# Patient Record
Sex: Female | Born: 1965 | Race: White | Hispanic: No | Marital: Married | Smoking: Never smoker
Health system: Southern US, Community
[De-identification: ages and names within clinical notes are randomized; demographics above are authoritative.]

## PROBLEM LIST (undated history)

## (undated) DIAGNOSIS — N840 Polyp of corpus uteri: Secondary | ICD-10-CM

## (undated) DIAGNOSIS — N926 Irregular menstruation, unspecified: Secondary | ICD-10-CM

---

## 1998-12-13 ENCOUNTER — Inpatient Hospital Stay (HOSPITAL_COMMUNITY): Admission: AD | Admit: 1998-12-13 | Discharge: 1998-12-16 | Payer: Self-pay | Admitting: Gynecology

## 1999-01-25 ENCOUNTER — Other Ambulatory Visit: Admission: RE | Admit: 1999-01-25 | Discharge: 1999-01-25 | Payer: Self-pay | Admitting: Gynecology

## 2000-04-11 ENCOUNTER — Ambulatory Visit (HOSPITAL_COMMUNITY): Admission: RE | Admit: 2000-04-11 | Discharge: 2000-04-11 | Payer: Self-pay | Admitting: Family Medicine

## 2000-04-11 ENCOUNTER — Encounter: Payer: Self-pay | Admitting: Family Medicine

## 2000-06-06 HISTORY — PX: TUBAL LIGATION: SHX77

## 2000-06-20 ENCOUNTER — Other Ambulatory Visit: Admission: RE | Admit: 2000-06-20 | Discharge: 2000-06-20 | Payer: Self-pay | Admitting: Gynecology

## 2001-06-06 ENCOUNTER — Ambulatory Visit (HOSPITAL_COMMUNITY): Admission: RE | Admit: 2001-06-06 | Discharge: 2001-06-06 | Payer: Self-pay | Admitting: Gynecology

## 2002-06-19 ENCOUNTER — Other Ambulatory Visit: Admission: RE | Admit: 2002-06-19 | Discharge: 2002-06-19 | Payer: Self-pay | Admitting: Gynecology

## 2002-11-28 ENCOUNTER — Ambulatory Visit (HOSPITAL_COMMUNITY): Admission: RE | Admit: 2002-11-28 | Discharge: 2002-11-28 | Payer: Self-pay | Admitting: Family Medicine

## 2002-11-28 ENCOUNTER — Encounter: Payer: Self-pay | Admitting: Family Medicine

## 2005-11-10 ENCOUNTER — Emergency Department (HOSPITAL_COMMUNITY): Admission: EM | Admit: 2005-11-10 | Discharge: 2005-11-10 | Payer: Self-pay | Admitting: Family Medicine

## 2006-09-15 ENCOUNTER — Other Ambulatory Visit: Admission: RE | Admit: 2006-09-15 | Discharge: 2006-09-15 | Payer: Self-pay | Admitting: Gynecology

## 2006-09-20 ENCOUNTER — Ambulatory Visit (HOSPITAL_COMMUNITY): Admission: RE | Admit: 2006-09-20 | Discharge: 2006-09-20 | Payer: Self-pay | Admitting: Gynecology

## 2006-10-03 ENCOUNTER — Encounter: Admission: RE | Admit: 2006-10-03 | Discharge: 2006-10-03 | Payer: Self-pay | Admitting: Gynecology

## 2007-03-26 ENCOUNTER — Encounter: Admission: RE | Admit: 2007-03-26 | Discharge: 2007-03-26 | Payer: Self-pay | Admitting: Gynecology

## 2007-12-21 ENCOUNTER — Emergency Department (HOSPITAL_COMMUNITY): Admission: EM | Admit: 2007-12-21 | Discharge: 2007-12-21 | Payer: Self-pay | Admitting: Emergency Medicine

## 2010-08-01 ENCOUNTER — Encounter: Payer: Self-pay | Admitting: Gynecology

## 2010-11-26 NOTE — Op Note (Signed)
[facility]  Patient:    Catherine Bailey, Catherine Bailey Visit Number: [id number] MRN: [id number]          Service Type: DSU Location: [facility] Attending Physician:  Douglass Rivers Dictated by:   Douglass Rivers, M.D. Proc. Date: 06/06/01 Admit Date:  06/06/2001                             Operative Report  PREOPERATIVE DIAGNOSIS:       Undesired fertility.  POSTOPERATIVE DIAGNOSIS:      Undesired fertility plus tubo-ovarian adhesions on the left and Bailey left ovarian cyst.  OPERATION:                    Bilateral tubal ligation with cautery and lysis of adhesions.  SURGEON:                      Douglass Rivers, M.D.  ANESTHESIA:                   General.  ESTIMATED BLOOD LOSS:         Minimal.  FINDINGS:                     Thick anterior wall to uterine adhesion on the right.  The right tube and ovary were normal.  The left ovary had Bailey 2-3 cm clear cystic functional cyst.  There were dense adhesions of the tube to the ovary almost its entire length.  The fimbria were also matted.  Normal liver, gallbladder and stomach.  COMPLICATIONS:                None.  PATHOLOGY:                    None.  DESCRIPTION OF PROCEDURE:     The patient was taken to the operating room. General anesthesia was induced.  The patient was placed in the dorsal lithotomy position, prepped and draped in the usual sterile fashion.  Bivalve speculum was placed in the vagina.  The cervix was visualized and the uterine manipulator was placed.  Gloves were changed and attention was then turned to the abdomen where an infraumbilical incision was made with the scalpel. Veress needle was inserted.  Opening pressure was 2.  Pneumoperitoneum was created until tympany was appreciated above the liver.  The Veress needle was then removed.  Bailey #10 disposable trocar was then inserted through the infraumbilical port.  Placement into the abdominal cavity was confirmed.  The findings were as above.   Initially we did not appreciate the tubo-ovarian adhesions.  The tubo-ovarian ligament on the left was grasped in order to better visualize the ovarian cyst.  It was felt to be due to the bluish color and its soft adherent feel.  It was felt to be functional and was left in place.  The right tube and ovary were also inspected and noted to be normal. The right tube was then elevated and cauterized in three serial segments until the probe read 0.  Attention was then turned to the left.  When we went to elevate the tube better, we realized there were Bailey marked amount of dense adhesions and Bailey second port would then be needed.  Under direct visualization the #5 port was placed in the left lateral lower quadrant.  Using this port and holding the tube on tension, the dense adhesions  were able to be noted.  They were too thick to be bluntly dissected.  Therefore, sharp dissection of the adhesions was performed.  There were multiple layers of the dense adhesions extending the entire girth of the tube into the portion of the posterior broad ligament to the tubo-ovarian ligament.  The adhesions were dissected off the tube almost in its entirety.  We were unable to adequately dissect the fimbriated portion which was markedly matted to the ovary.  However, we felt that this was not that important as the patient does not desire any fertility.  The remainder of the tube was successfully dissected off.  We were also able to get Bailey better visualization of the ovary and confirmed the sense that this was indeed Bailey functional cyst.  The tube was then elevated, grasped with Klepingers and cauterized in Bailey similar fashion in three serial segments until the instrument read 0.  The upper abdomen was inspected and noted to be normal.  The instruments were then removed from the pelvis, the pneumoperitoneum was released under direct visualization.  The infraumbilical port was closed with Bailey figure-of-eight of 0 Vicryl.   The skin was noted to naturally fold together.  It was, therefore, closed with Dermabond.  The two ports were injected with Bailey total of 7.5 cc of 0.25% Marcaine solution.  The instruments were then removed from the vagina and the cervix was noted to be hemostatic.  The patient tolerated the procedure well.  Sponge, lap and needle counts were correct x 2. She was transferred to the PACU in stable condition. Dictated by:   Douglass Rivers, M.D. Attending Physician:  Douglass Rivers DD:  06/06/01 TD:  06/07/01 Job: [id number]

## 2011-12-13 ENCOUNTER — Other Ambulatory Visit: Payer: Self-pay | Admitting: Gynecology

## 2011-12-13 DIAGNOSIS — Z1231 Encounter for screening mammogram for malignant neoplasm of breast: Secondary | ICD-10-CM

## 2011-12-23 ENCOUNTER — Ambulatory Visit
Admission: RE | Admit: 2011-12-23 | Discharge: 2011-12-23 | Disposition: A | Discharge status: Home / Self Care | Payer: 59 | Source: Ambulatory Visit | Attending: Gynecology | Admitting: Gynecology

## 2011-12-23 DIAGNOSIS — Z1231 Encounter for screening mammogram for malignant neoplasm of breast: Secondary | ICD-10-CM

## 2012-02-06 ENCOUNTER — Emergency Department (HOSPITAL_COMMUNITY)
Admission: EM | Admit: 2012-02-06 | Discharge: 2012-02-06 | Disposition: A | Discharge status: Home / Self Care | Payer: 59 | Source: Home / Self Care

## 2012-02-06 ENCOUNTER — Encounter (HOSPITAL_COMMUNITY): Payer: Self-pay | Admitting: *Deleted

## 2012-02-06 DIAGNOSIS — J069 Acute upper respiratory infection, unspecified: Secondary | ICD-10-CM

## 2012-02-06 DIAGNOSIS — J3489 Other specified disorders of nose and nasal sinuses: Secondary | ICD-10-CM

## 2012-02-06 DIAGNOSIS — R0981 Nasal congestion: Secondary | ICD-10-CM

## 2012-02-06 DIAGNOSIS — R05 Cough: Secondary | ICD-10-CM

## 2012-02-06 MED ORDER — CETIRIZINE-PSEUDOEPHEDRINE ER 5-120 MG PO TB12: 1.0000 | ORAL_TABLET | Freq: Every day | ORAL | Status: AC

## 2012-02-06 NOTE — ED Provider Notes (Signed)
Medical screening examination/treatment/procedure(s) were performed by non-physician practitioner and as supervising physician I was immediately available for consultation/collaboration.  Raynald Blend, MD 02/06/12 1324

## 2012-02-06 NOTE — ED Provider Notes (Signed)
History     CSN: [id number]  Arrival date & time 02/06/12  1118   None     Chief Complaint  Patient presents with  . Sore Throat    (Consider location/radiation/quality/duration/timing/severity/associated sxs/prior treatment) The history is provided by the patient.  Ramla is a 46 y.o. female who complains of onset of cold symptoms for 6 days, symptoms improved but nasal congestion, cough and hoarse voice persists.  Scratchy sore throat + cough, yellow phlegm No pleuritic pain No wheezing + nasal congestion No post-nasal drainage No sinus pain/pressure + laryngitis No chest congestion No itchy/red eyes No earache No hemoptysis No SOB No chills/sweats No fever No nausea No vomiting No abdominal pain No diarrhea No skin rashes No fatigue No myalgias + headache  No ill contacts  History reviewed. No pertinent past medical history.  History reviewed. No pertinent past surgical history.  No family history on file.  History  Substance Use Topics  . Smoking status: Not on file  . Smokeless tobacco: Not on file  . Alcohol Use: Not on file    OB History    Grav Para Term Preterm Abortions TAB SAB Ect Mult Living                  Review of Systems  All other systems reviewed and are negative.    Allergies  Review of patient's allergies indicates no known allergies.  Home Medications   Current Outpatient Rx  Name Route Sig Dispense Refill  . CETIRIZINE-PSEUDOEPHEDRINE ER 5-120 MG PO TB12 Oral Take 1 tablet by mouth daily. 30 tablet 0    BP 121/77  Pulse 92  Temp 99 F (37.2 C) (Oral)  Resp 17  SpO2 97%  LMP 02/05/2012  Physical Exam  Nursing note and vitals reviewed. Constitutional: She is oriented to person, place, and time. Vital signs are normal. She appears well-developed and well-nourished. She is active and cooperative.  HENT:  Head: Normocephalic. No trismus in the jaw.  Right Ear: Hearing, tympanic membrane, external ear and ear canal  normal.  Left Ear: Hearing, tympanic membrane, external ear and ear canal normal.  Nose: Mucosal edema present.  Mouth/Throat: Uvula is midline, oropharynx is clear and moist and mucous membranes are normal. No uvula swelling. No posterior oropharyngeal edema or posterior oropharyngeal erythema.  Eyes: Conjunctivae and EOM are normal. Pupils are equal, round, and reactive to light. No scleral icterus.  Neck: Trachea normal. Neck supple.  Cardiovascular: Normal rate, regular rhythm, normal heart sounds and normal pulses.   Pulmonary/Chest: Effort normal and breath sounds normal. She exhibits no tenderness.  Abdominal: Soft. Normal appearance and bowel sounds are normal. There is no tenderness.  Lymphadenopathy:       Head (right side): No submental, no submandibular, no tonsillar, no preauricular, no posterior auricular and no occipital adenopathy present.       Head (left side): No submental, no submandibular, no tonsillar, no preauricular, no posterior auricular and no occipital adenopathy present.    She has no cervical adenopathy.  Neurological: She is alert and oriented to person, place, and time. No cranial nerve deficit or sensory deficit. GCS eye subscore is 4. GCS verbal subscore is 5. GCS motor subscore is 6.  Skin: Skin is warm and dry.  Psychiatric: She has a normal mood and affect. Her speech is normal and behavior is normal. Judgment and thought content normal. Cognition and memory are normal.    ED Course  Procedures (including critical care time)  Labs Reviewed - No data to display No results found.   1. URI (upper respiratory infection)   2. Cough   3. Nasal congestion       MDM  Increase fluid intake, rest.  Antibiotics not indicated..  Begin expectorant/decongestant, topical decongestant, saline nasal spray and/or saline irrigation, and cough suppressant at bedtime. Antihistamines of your choice (Claritin or Zyrtec).  Tylenol or Motrin for fever/discomfort.   Followup with PCP if not improving 7 to 10 days.       Johnsie Kindred, NP 02/06/12 1230

## 2012-02-06 NOTE — ED Notes (Signed)
Pt reports  Symptoms  Of  sorethroat           chest  Congestion     As  Well  As  Some hoarseness     -  Symptoms    X      6  Days       -  Pt  Sitting upright on exam table  Speaking in  Complete  sentances          Voice is  Hoarse  Yet  In no  Acute  Distress

## 2012-02-20 ENCOUNTER — Encounter (HOSPITAL_COMMUNITY): Payer: Self-pay | Admitting: *Deleted

## 2012-02-20 ENCOUNTER — Emergency Department (HOSPITAL_COMMUNITY)
Admission: EM | Admit: 2012-02-20 | Discharge: 2012-02-20 | Disposition: A | Discharge status: Home / Self Care | Payer: 59 | Source: Home / Self Care | Attending: Emergency Medicine | Admitting: Emergency Medicine

## 2012-02-20 DIAGNOSIS — R05 Cough: Secondary | ICD-10-CM

## 2012-02-20 MED ORDER — GUAIFENESIN-CODEINE 100-10 MG/5ML PO SYRP: 5.0000 mL | ORAL_SOLUTION | Freq: Three times a day (TID) | ORAL | Status: DC | PRN

## 2012-02-20 MED ORDER — PREDNISONE 10 MG PO TABS: 20.0000 mg | ORAL_TABLET | Freq: Every day | ORAL | Status: AC

## 2012-02-20 MED ORDER — AMOXICILLIN-POT CLAVULANATE 400-57 MG PO CHEW: 1.0000 | CHEWABLE_TABLET | Freq: Two times a day (BID) | ORAL | Status: DC

## 2012-02-20 NOTE — ED Notes (Signed)
Patient denies pain and is resting comfortably.  

## 2012-02-20 NOTE — ED Notes (Signed)
Pt reports cold like symptoms that have not been relieved with otc meds since being seen here 3 weeks ago.

## 2012-02-20 NOTE — ED Provider Notes (Signed)
History     CSN: [id number]  Arrival date & time 02/20/12  1209   First MD Initiated Contact with Patient 02/20/12 1231      Chief Complaint  Patient presents with  . URI    (Consider location/radiation/quality/duration/timing/severity/associated sxs/prior treatment) HPI Comments: Patient presents urgent care complaining of cough productive of occasional phlegm and has been persistent somewhat for the last 3 weeks. She denies any fevers, body aches such as myalgias arthralgias, changes in appetite unintentional weight loss, no abdominal pain or diarrhea. She suspects that she must have acquired an infection of some sort after [place] 3 [place] ago. As her respiratory symptoms started since then. Patient denies any shortness of breath, chest pains. She does not smoke.  Patient is a 46 y.o. female presenting with URI. The history is provided by the patient. No language interpreter was used.  URI The primary symptoms include cough. Primary symptoms do not include fever, fatigue, wheezing, abdominal pain, nausea, vomiting, arthralgias or rash. The current episode started yesterday. This is a new problem.  Symptoms associated with the illness include facial pain, sinus pressure and congestion. The illness is not associated with chills.    History reviewed. No pertinent past medical history.  History reviewed. No pertinent past surgical history.  Family History  Problem Relation Age of Onset  . Family history unknown: Yes    History  Substance Use Topics  . Smoking status: Never Smoker   . Smokeless tobacco: Not on file  . Alcohol Use: No    OB History    Grav Para Term Preterm Abortions TAB SAB Ect Mult Living                  Review of Systems  Constitutional: Negative for fever, chills, diaphoresis, activity change and fatigue.  HENT: Positive for congestion and sinus pressure.   Respiratory: Positive for cough. Negative for shortness  of breath and wheezing.   Cardiovascular: Negative for chest pain, palpitations and leg swelling.  Gastrointestinal: Negative for nausea, vomiting and abdominal pain.  Musculoskeletal: Negative for back pain, joint swelling and arthralgias.  Skin: Negative for rash.  Neurological: Negative for dizziness and light-headedness.    Allergies  Review of patient's allergies indicates no known allergies.  Home Medications   Current Outpatient Rx  Name Route Sig Dispense Refill  . CETIRIZINE-PSEUDOEPHEDRINE ER 5-120 MG PO TB12 Oral Take 1 tablet by mouth daily. 30 tablet 0  . AMOXICILLIN-POT CLAVULANATE 400-57 MG PO CHEW Oral Chew 1 tablet by mouth 2 (two) times daily. 10 tablet 0  . GUAIFENESIN-CODEINE 100-10 MG/5ML PO SYRP Oral Take 5 mLs by mouth 3 (three) times daily as needed for cough. 120 mL 0  . PREDNISONE 10 MG PO TABS Oral Take 2 tablets (20 mg total) by mouth daily. 15 tablet 0    BP 122/81  Pulse 74  Temp 98.3 F (36.8 C) (Oral)  Resp 16  SpO2 100%  LMP 02/05/2012  Physical Exam  Nursing note and vitals reviewed. Constitutional: Vital signs are normal. She appears well-developed and well-nourished.  Non-toxic appearance. She does not have a sickly appearance. She does not appear ill.  HENT:  Head: Normocephalic.  Right Ear: Tympanic membrane normal.  Left Ear: Tympanic membrane normal.  Mouth/Throat: Uvula is midline and oropharynx is clear and moist.  Eyes: Conjunctivae are normal.  Neck: Neck supple.  Cardiovascular: Normal rate.  Exam reveals no gallop and no friction rub.  No murmur heard. Pulmonary/Chest: Effort normal and breath sounds normal. No respiratory distress. She has no decreased breath sounds. She has no wheezes. She has no rhonchi. She has no rales. She exhibits no tenderness.  Musculoskeletal: Normal range of motion.  Neurological: She is alert.  Skin: No rash noted. No erythema.    ED Course  Procedures (including critical care time)  Labs  Reviewed - No data to display No results found.   1. Cough       MDM   Recurrent cough, pulse oxygenation 100% and a normal respiratory exam. Patient looks comfortable denies any dyspareunia or fevers. Suspect this is most likely lumbar mental type of Provigil and does congestion. Have recommended patient to take prednisone course for 5 days along with codeine and guaifenesin syrup. If no significant improvement in [id number] start with provided antibiotic. Have advised patient to return if symptoms are not fully resolve by 7 days. She agreed and understood plan of care and followup care as necessary.       Jimmie Molly, MD 02/20/12 [phone]

## 2012-02-20 NOTE — ED Notes (Signed)
Call from pharmacy for Rx clarification

## 2012-02-21 ENCOUNTER — Encounter: Payer: 59 | Admitting: Family Medicine

## 2012-02-27 ENCOUNTER — Encounter: Payer: Self-pay | Admitting: Internal Medicine

## 2012-02-27 ENCOUNTER — Ambulatory Visit (INDEPENDENT_AMBULATORY_CARE_PROVIDER_SITE_OTHER)
Admission: RE | Admit: 2012-02-27 | Discharge: 2012-02-27 | Disposition: A | Discharge status: Home / Self Care | Payer: 59 | Source: Ambulatory Visit | Attending: Internal Medicine | Admitting: Internal Medicine

## 2012-02-27 ENCOUNTER — Ambulatory Visit (INDEPENDENT_AMBULATORY_CARE_PROVIDER_SITE_OTHER): Payer: 59 | Admitting: Internal Medicine

## 2012-02-27 VITALS — BP 108/72 | HR 76 | Temp 98.6°F | Ht 63.0 in | Wt 144.0 lb

## 2012-02-27 DIAGNOSIS — R05 Cough: Secondary | ICD-10-CM | POA: Insufficient documentation

## 2012-02-27 DIAGNOSIS — R5381 Other malaise: Secondary | ICD-10-CM

## 2012-02-27 DIAGNOSIS — R5383 Other fatigue: Secondary | ICD-10-CM

## 2012-02-27 DIAGNOSIS — L659 Nonscarring hair loss, unspecified: Secondary | ICD-10-CM

## 2012-02-27 DIAGNOSIS — R053 Chronic cough: Secondary | ICD-10-CM | POA: Insufficient documentation

## 2012-02-27 LAB — HEPATIC FUNCTION PANEL
AST: 12 U/L (ref 0–37)
Bilirubin, Direct: 0 mg/dL (ref 0.0–0.3)
Total Bilirubin: 0.4 mg/dL (ref 0.3–1.2)

## 2012-02-27 LAB — IBC PANEL
Saturation Ratios: 32.2 % (ref 20.0–50.0)
Transferrin: 224.3 mg/dL (ref 212.0–360.0)

## 2012-02-27 LAB — T4, FREE: Free T4: 0.92 ng/dL (ref 0.60–1.60)

## 2012-02-27 LAB — CBC WITH DIFFERENTIAL/PLATELET
Basophils Absolute: 0 10*3/uL (ref 0.0–0.1)
Eosinophils Relative: 1.1 % (ref 0.0–5.0)
HCT: 41.9 % (ref 36.0–46.0)
Lymphocytes Relative: 28.9 % (ref 12.0–46.0)
Lymphs Abs: 3.3 10*3/uL (ref 0.7–4.0)
Monocytes Relative: 5.7 % (ref 3.0–12.0)
Neutrophils Relative %: 64.1 % (ref 43.0–77.0)
Platelets: 293 10*3/uL (ref 150.0–400.0)
RDW: 13 % (ref 11.5–14.6)
WBC: 11.3 10*3/uL — ABNORMAL HIGH (ref 4.5–10.5)

## 2012-02-27 LAB — BASIC METABOLIC PANEL
BUN: 16 mg/dL (ref 6–23)
Calcium: 9 mg/dL (ref 8.4–10.5)
Creatinine, Ser: 0.7 mg/dL (ref 0.4–1.2)
GFR: 95.25 mL/min (ref >60.00)
Glucose, Bld: 89 mg/dL (ref 70–99)

## 2012-02-27 LAB — TSH: TSH: 2.73 u[IU]/mL (ref 0.35–5.50)

## 2012-02-27 LAB — FERRITIN: Ferritin: 31.3 ng/mL (ref 10.0–291.0)

## 2012-02-27 MED ORDER — AZITHROMYCIN 250 MG PO TABS: ORAL_TABLET | ORAL | Status: AC

## 2012-02-27 NOTE — Patient Instructions (Addendum)
Our office will contact you re: blood test and chest x ray results 

## 2012-02-27 NOTE — Assessment & Plan Note (Signed)
46 year old white female with chronic cough x3-4 weeks. When her symptoms first started she had associated fatigue and generalized achiness consistent with viral infection. She had minimal improvement with prednisone taper. She has postnasal drip and mild sinus congestion. Consider mild secondary sinusitis. Obtain chest x-ray to rule out atypical pneumonia. Treat with azithromycin 500 mg once daily for 3 days.  Patient advised to call office if symptoms persist or worsen.

## 2012-02-27 NOTE — Assessment & Plan Note (Signed)
Check TFTs and iron studies.

## 2012-02-27 NOTE — Progress Notes (Signed)
Subjective:    Patient ID: Catherine Bailey, female    DOB: [birth year], 46 y.o.   MRN: [id number]  HPI  46 year old white female to establish. Patient denies any chronic medical history. She has suffered with productive cough over last 3-4 weeks. Her symptoms started shortly after she went swimming at [facility]. Patient and her father have been particularly concerned because there have been reports of a dead cow contaminating [place] water [place] the [place].   Father is aware of multiple people getting sick after swimming in the lake.  She was seen at urgent care at 2 weeks ago. She was initially started on Zyrtec-D over-the-counter. She went back to persistent cough and was started on prednisone taper. She reports minimal improvement with prednisone. Cough is productive of slightly yellowish sputum. Her symptoms are worse in the morning. She denies fever or chills. She has mild sinus congestion.   Review of Systems   Constitutional: Negative for activity change, appetite change and unexpected weight change.  Eyes: Negative for visual disturbance.  Respiratory: Positive for cough, negative forshortness of breath.   Cardiovascular: Negative for chest pain.  Genitourinary: Negative for difficulty urinating.  Neurological: Negative for headaches.  Gastrointestinal: Negative for abdominal pain, heartburn melena or hematochezia  No past medical history on file.  History   Social History  . Marital Status: Married    Spouse Name: N/A    Number of Children: N/A  . Years of Education: N/A   Occupational History  . Not on file.   Social History Main Topics  . Smoking status: Never Smoker   . Smokeless tobacco: Not on file  . Alcohol Use: No  . Drug Use: No  . Sexually Active: No   Other Topics Concern  . Not on file   Social History Narrative  . No narrative on file    No past surgical history on file.  No family history on file.  No Known Allergies  Current Outpatient  Prescriptions on File Prior to Visit  Medication Sig Dispense Refill  . cetirizine-pseudoephedrine (ZYRTEC-D) 5-120 MG per tablet Take 1 tablet by mouth daily.  30 tablet  0  . sodium chloride ([employer] SALINE NASAL SPRAY) 0.65 % nasal spray Place 1 spray into the nose as needed.        BP 108/72  Pulse 76  Temp 98.6 F (37 C) (Oral)  Ht 5\' 3"  (1.6 m)  Wt 144 lb (65.318 kg)  BMI 25.51 kg/m2  LMP 02/20/2012       Objective:   Physical Exam  Constitutional: She is oriented to person, place, and time. She appears well-developed and well-nourished.  HENT:  Head: Normocephalic and atraumatic.  Right Ear: External ear normal.  Left Ear: External ear normal.  Mouth/Throat: Oropharynx is clear and moist.       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 Pupils are equal, round, and reactive to light.  Neck: Neck supple.  Cardiovascular: Normal rate, regular rhythm and normal heart sounds.   No murmur heard. Pulmonary/Chest: Effort normal and breath sounds normal. She has no wheezes. She has no rales.  Abdominal: Soft. Bowel sounds are normal. She exhibits no mass. There is no tenderness.  Musculoskeletal: She exhibits no edema.  Lymphadenopathy:    She has no cervical adenopathy.  Neurological: She is alert and oriented to person, place, and time. No cranial nerve deficit.  Skin: Skin is warm and dry.  Psychiatric: She has a normal mood and  affect. Her behavior is normal.          Assessment & Plan:

## 2012-02-29 ENCOUNTER — Telehealth: Payer: Self-pay | Admitting: Internal Medicine

## 2012-02-29 NOTE — Telephone Encounter (Signed)
Pts father called and said that her daughter came in to see Dr Clent Ridges for ? Bacterial inf on Monday 03/29/12. Pt had labs drawn and chest xray. Haven't rcvd results. Pt is home and is very sick. Pls call.

## 2012-02-29 NOTE — Telephone Encounter (Signed)
Called father and explained to father that I can not talk to him.  Told father to tell pt to call me and she could relay the information to him.

## 2013-05-07 ENCOUNTER — Ambulatory Visit (INDEPENDENT_AMBULATORY_CARE_PROVIDER_SITE_OTHER): Payer: 59 | Admitting: Internal Medicine

## 2013-05-07 ENCOUNTER — Encounter: Payer: Self-pay | Admitting: Internal Medicine

## 2013-05-07 VITALS — BP 130/90 | HR 98 | Temp 98.0°F | Resp 20 | Wt 180.0 lb

## 2013-05-07 DIAGNOSIS — M542 Cervicalgia: Secondary | ICD-10-CM

## 2013-05-07 MED ORDER — METHYLPREDNISOLONE ACETATE 80 MG/ML IJ SUSP
80.0000 mg | Freq: Once | INTRAMUSCULAR | Status: AC
  Administered 2013-05-07: 80 mg via INTRAMUSCULAR

## 2013-05-07 MED ORDER — HYDROCODONE-ACETAMINOPHEN 5-325 MG PO TABS: 1.0000 | ORAL_TABLET | Freq: Four times a day (QID) | ORAL | Status: DC | PRN

## 2013-05-07 MED ORDER — CYCLOBENZAPRINE HCL 10 MG PO TABS: 10.0000 mg | ORAL_TABLET | Freq: Three times a day (TID) | ORAL | Status: DC | PRN

## 2013-05-07 NOTE — Patient Instructions (Signed)
Call or return to clinic prn if these symptoms worsen or fail to improve as anticipated. Cervical Sprain A cervical sprain is an injury in the neck in which the ligaments are stretched or torn. The ligaments are the tissues that hold the bones of the neck (vertebrae) in place.Cervical sprains can range from very mild to very severe. Most cervical sprains get better in 1 to 3 weeks, but it depends on the cause and extent of the injury. Severe cervical sprains can cause the neck vertebrae to be unstable. This can lead to damage of the spinal cord and can result in serious nervous system problems. Your caregiver will determine whether your cervical sprain is mild or severe. CAUSES  Severe cervical sprains may be caused by:  Contact sport injuries (football, rugby, wrestling, hockey, auto racing, gymnastics, diving, martial arts, boxing).  Motor vehicle collisions.  Whiplash injuries. This means the neck is forcefully whipped backward and forward.  Falls. Mild cervical sprains may be caused by:   Awkward positions, such as cradling a telephone between your ear and shoulder.  Sitting in a chair that does not offer proper support.  Working at a poorly Marketing executive station.  Activities that require looking up or down for long periods of time. SYMPTOMS   Pain, soreness, stiffness, or a burning sensation in the front, back, or sides of the neck. This discomfort may develop immediately after injury or it may develop slowly and not begin for 24 hours or more after an injury.  Pain or tenderness directly in the middle of the back of the neck.  Shoulder or upper back pain.  Limited ability to move the neck.  Headache.  Dizziness.  Weakness, numbness, or tingling in the hands or arms.  Muscle spasms.  Difficulty swallowing or chewing.  Tenderness and swelling of the neck. DIAGNOSIS  Most of the time, your caregiver can diagnose this problem by taking your history and doing a  physical exam. Your caregiver will ask about any known problems, such as arthritis in the neck or a previous neck injury. X-rays may be taken to find out if there are any other problems, such as problems with the bones of the neck. However, an X-ray often does not reveal the full extent of a cervical sprain. Other tests such as a computed tomography (CT) scan or magnetic resonance imaging (MRI) may be needed. TREATMENT  Treatment depends on the severity of the cervical sprain. Mild sprains can be treated with rest, keeping the neck in place (immobilization), and pain medicines. Severe cervical sprains need immediate immobilization and an appointment with an orthopedist or neurosurgeon. Several treatment options are available to help with pain, muscle spasms, and other symptoms. Your caregiver may prescribe:  Medicines, such as pain relievers, numbing medicines, or muscle relaxants.  Physical therapy. This can include stretching exercises, strengthening exercises, and posture training. Exercises and improved posture can help stabilize the neck, strengthen muscles, and help stop symptoms from returning.  A neck collar to be worn for short periods of time. Often, these collars are worn for comfort. However, certain collars may be worn to protect the neck and prevent further worsening of a serious cervical sprain. HOME CARE INSTRUCTIONS   Put ice on the injured area.  Put ice in a plastic bag.  Place a towel between your skin and the bag.  Leave the ice on for 15-20 minutes, 3-4 times a day.  Only take over-the-counter or prescription medicines for pain, discomfort, or fever as directed  by your caregiver.  Keep all follow-up appointments as directed by your caregiver.  Keep all physical therapy appointments as directed by your caregiver.  If a neck collar is prescribed, wear it as directed by your caregiver.  Do not drive while wearing a neck collar.  Make any needed adjustments to your work  station to promote good posture.  Avoid positions and activities that make your symptoms worse.  Warm up and stretch before being active to help prevent problems. SEEK MEDICAL CARE IF:   Your pain is not controlled with medicine.  You are unable to decrease your pain medicine over time as planned.  Your activity level is not improving as expected. SEEK IMMEDIATE MEDICAL CARE IF:   You develop any bleeding, stomach upset, or signs of an allergic reaction to your medicine.  Your symptoms get worse.  You develop new, unexplained symptoms.  You have numbness, tingling, weakness, or paralysis in any part of your body. MAKE SURE YOU:   Understand these instructions.  Will watch your condition.  Will get help right away if you are not doing well or get worse. Document Released: 04/24/2007 Document Revised: 09/19/2011 Document Reviewed: 03/30/2011 [facility] Patient Information 2014 [place], [place].

## 2013-05-07 NOTE — Progress Notes (Signed)
  Subjective:    Patient ID: Catherine Bailey, female    DOB: 05/23/1966, 47 y.o.   MRN: [id number]  HPI  47 year old patient who presents with a nine-day history of pain in the posterior neck left shoulder that radiates to the right elbow. 2 days prior she spent time cleaning  windows which was an unusual activity.  No prior history of neck pain or other trauma. No constitutional complaints. She has been using Advil and Tylenol and still quite uncomfortable. Pain has interfered with sleep.  History reviewed. No pertinent past medical history.  History   Social History  . Marital Status: Married    Spouse Name: N/A    Number of Children: N/A  . Years of Education: N/A   Occupational History  . Not on file.   Social History Main Topics  . Smoking status: Never Smoker   . Smokeless tobacco: Not on file  . Alcohol Use: No  . Drug Use: No  . Sexual Activity: No   Other Topics Concern  . Not on file   Social History Narrative  . No narrative on file    History reviewed. No pertinent past surgical history.  No family history on file.  No Known Allergies  No current outpatient prescriptions on file prior to visit.   No current facility-administered medications on file prior to visit.    BP 130/90  Pulse 98  Temp(Src) 98 F (36.7 C) (Oral)  Resp 20  Wt 180 lb (81.647 kg)  BMI 31.89 kg/m2  SpO2 97%  LMP 04/22/2013       Review of Systems  Constitutional: Negative.   HENT: Negative for congestion, dental problem, hearing loss, rhinorrhea, sinus pressure, sore throat and tinnitus.   Eyes: Negative for pain, discharge and visual disturbance.  Respiratory: Negative for cough and shortness of breath.   Cardiovascular: Negative for chest pain, palpitations and leg swelling.  Gastrointestinal: Negative for nausea, vomiting, abdominal pain, diarrhea, constipation, blood in stool and abdominal distention.  Genitourinary: Negative for dysuria, urgency, frequency, hematuria,  flank pain, vaginal bleeding, vaginal discharge, difficulty urinating, vaginal pain and pelvic pain.  Musculoskeletal: Positive for neck pain. Negative for arthralgias, gait problem and joint swelling.  Skin: Negative for rash.  Neurological: Negative for dizziness, syncope, speech difficulty, weakness, numbness and headaches.  Hematological: Negative for adenopathy.  Psychiatric/Behavioral: Negative for behavioral problems, dysphoric mood and agitation. The patient is not nervous/anxious.        Objective:   Physical Exam  Constitutional: She appears well-developed and well-nourished. No distress.  Neck: Normal range of motion.  Extension of the neck and turning to the left and right does tend to aggravate the neck discomfort  Neurological:  Normal grip strength Normal arm extension and flexion Biceps and triceps reflexes normal          Assessment & Plan:   Cervical strain. Possible radiculopathy. We'll treat with Depo-Medrol rest and also with analgesics and observe. We'll notify the office if there is any clinical deterioration or if she fails to improve. She will consider a soft cervical collar

## 2013-05-17 ENCOUNTER — Encounter: Payer: Self-pay | Admitting: Internal Medicine

## 2013-05-17 ENCOUNTER — Ambulatory Visit (INDEPENDENT_AMBULATORY_CARE_PROVIDER_SITE_OTHER): Payer: 59 | Admitting: Internal Medicine

## 2013-05-17 VITALS — BP 126/80 | HR 120 | Temp 97.8°F | Resp 20 | Wt 177.0 lb

## 2013-05-17 DIAGNOSIS — S161XXS Strain of muscle, fascia and tendon at neck level, sequela: Secondary | ICD-10-CM

## 2013-05-17 DIAGNOSIS — IMO0002 Reserved for concepts with insufficient information to code with codable children: Secondary | ICD-10-CM

## 2013-05-17 MED ORDER — METHYLPREDNISOLONE ACETATE 80 MG/ML IJ SUSP
80.0000 mg | Freq: Once | INTRAMUSCULAR | Status: AC
  Administered 2013-05-17: 80 mg via INTRAMUSCULAR

## 2013-05-17 NOTE — Patient Instructions (Signed)
Call or return to clinic prn if these symptoms worsen or fail to improve as anticipated.

## 2013-05-17 NOTE — Progress Notes (Signed)
Subjective:    Patient ID: Catherine Bailey, female    DOB: Sep 14, 1965, 47 y.o.   MRN: [id number]  HPI 47 year old patient who is seen today for followup of right posterior neck right upper back and shoulder discomfort that radiates to the right elbow region. She was seen 10 days ago and states that she has had modest clinical improvement. She has been symptomatic for almost 3 weeks. Symptoms followed vigorous cleaning of multiple windows. No motor weakness. She did try a soft cervical collar but was a poor fit and was not well tolerated. She is requesting another Depo-Medrol shot that she felt was quite helpful. She has been using most relaxers and analgesics sparingly he  History reviewed. No pertinent past medical history.  History   Social History  . Marital Status: Married    Spouse Name: N/A    Number of Children: N/A  . Years of Education: N/A   Occupational History  . Not on file.   Social History Main Topics  . Smoking status: Never Smoker   . Smokeless tobacco: Not on file  . Alcohol Use: No  . Drug Use: No  . Sexual Activity: No   Other Topics Concern  . Not on file   Social History Narrative  . No narrative on file    History reviewed. No pertinent past surgical history.  No family history on file.  No Known Allergies  Current Outpatient Prescriptions on File Prior to Visit  Medication Sig Dispense Refill  . acetaminophen (TYLENOL) 500 MG tablet Take 1,000 mg by mouth every 6 (six) hours as needed for pain.      . cyclobenzaprine (FLEXERIL) 10 MG tablet Take 1 tablet (10 mg total) by mouth 3 (three) times daily as needed for muscle spasms.  30 tablet  0  . HYDROcodone-acetaminophen (NORCO/VICODIN) 5-325 MG per tablet Take 1 tablet by mouth every 6 (six) hours as needed for pain.  30 tablet  0  . ibuprofen (ADVIL) 200 MG tablet Take 600 mg by mouth every 6 (six) hours as needed for pain.       No current facility-administered medications on file prior to visit.     BP 126/80  Pulse 120  Temp(Src) 97.8 F (36.6 C) (Oral)  Resp 20  Wt 177 lb (80.287 kg)  SpO2 98%  LMP 04/22/2013       Review of Systems  Constitutional: Negative.   HENT: Negative for congestion, dental problem, hearing loss, rhinorrhea, sinus pressure, sore throat and tinnitus.   Eyes: Negative for pain, discharge and visual disturbance.  Respiratory: Negative for cough and shortness of breath.   Cardiovascular: Negative for chest pain, palpitations and leg swelling.  Gastrointestinal: Negative for nausea, vomiting, abdominal pain, diarrhea, constipation, blood in stool and abdominal distention.  Genitourinary: Negative for dysuria, urgency, frequency, hematuria, flank pain, vaginal bleeding, vaginal discharge, difficulty urinating, vaginal pain and pelvic pain.  Musculoskeletal: Positive for neck pain. Negative for arthralgias, gait problem and joint swelling.  Skin: Negative for rash.  Neurological: Negative for dizziness, syncope, speech difficulty, weakness, numbness and headaches.  Hematological: Negative for adenopathy.  Psychiatric/Behavioral: Negative for behavioral problems, dysphoric mood and agitation. The patient is not nervous/anxious.        Objective:   Physical Exam  Constitutional: She is oriented to person, place, and time. She appears well-developed and well-nourished. No distress.  Eyes: Pupils are equal, round, and reactive to light.  Neck: Normal range of motion. Neck supple. No thyromegaly present.  Cardiovascular: Normal rate and regular rhythm.   Repeat blood pressure 120/70 with pulse rate 9200  Abdominal: Bowel sounds are normal. She exhibits no mass. There is no tenderness.  Musculoskeletal: Normal range of motion. She exhibits tenderness.  Fairly full range of motion of the head and neck. Returning to the extreme left did tend to aggravate discomfort in the right upper back and shoulder region Patellar and triceps reflexes were brisk and  equal Normal grip strength as well as on flexion and extension Normal shoulder shrug  Lymphadenopathy:    She has no cervical adenopathy.  Neurological: She is alert and oriented to person, place, and time.  Skin: Skin is dry. No rash noted.  Psychiatric: She has a normal mood and affect. Her behavior is normal.          Assessment & Plan:   Cervical strain versus mild cervical radiculopathy. Appears to be improving. We'll retreat with Depo-Medrol and continuous supportive and symptomatic care. She will  call if there is any clinical deterioration or she fails to improve

## 2013-08-29 IMAGING — CR DG CHEST 2V
2 series · 2 of 2 positions shown · non-contrast
Comparison: None.

CLINICAL DATA: Cough

CHEST - 2 VIEW

[view not recorded (1 of 2)]
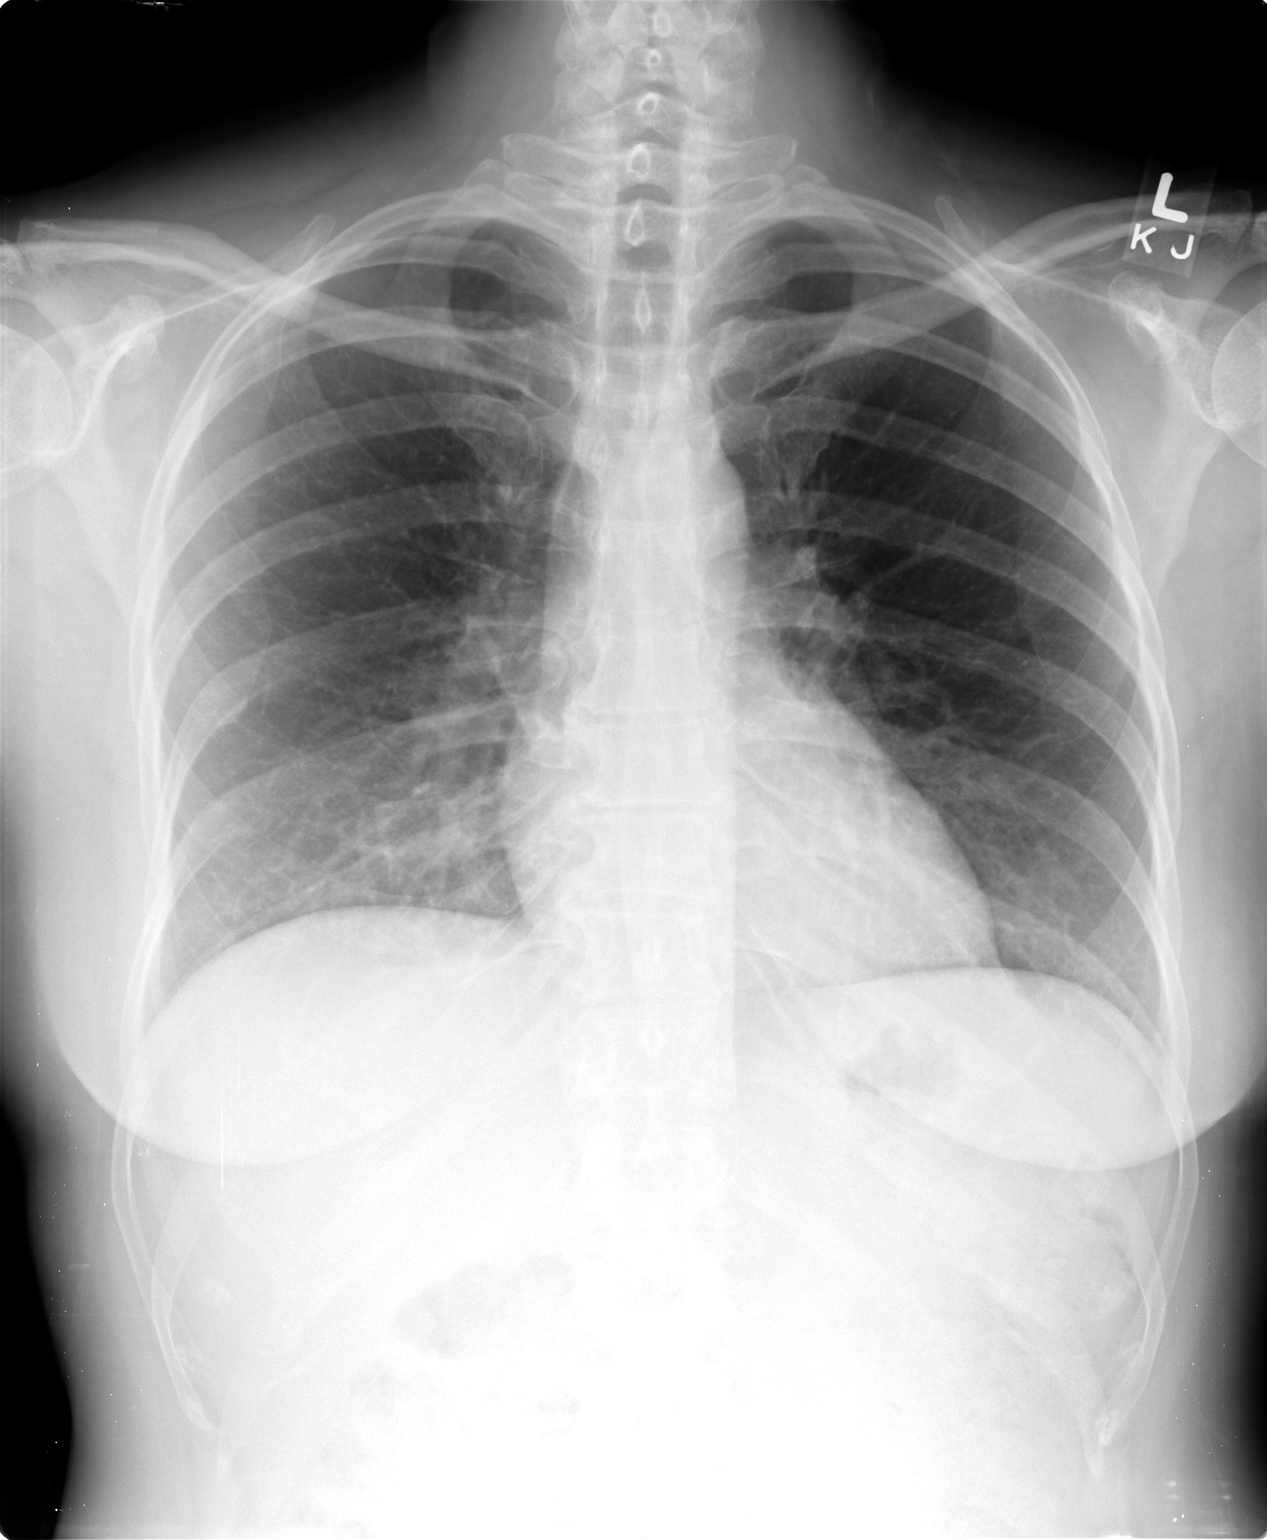

[view not recorded (2 of 2)]
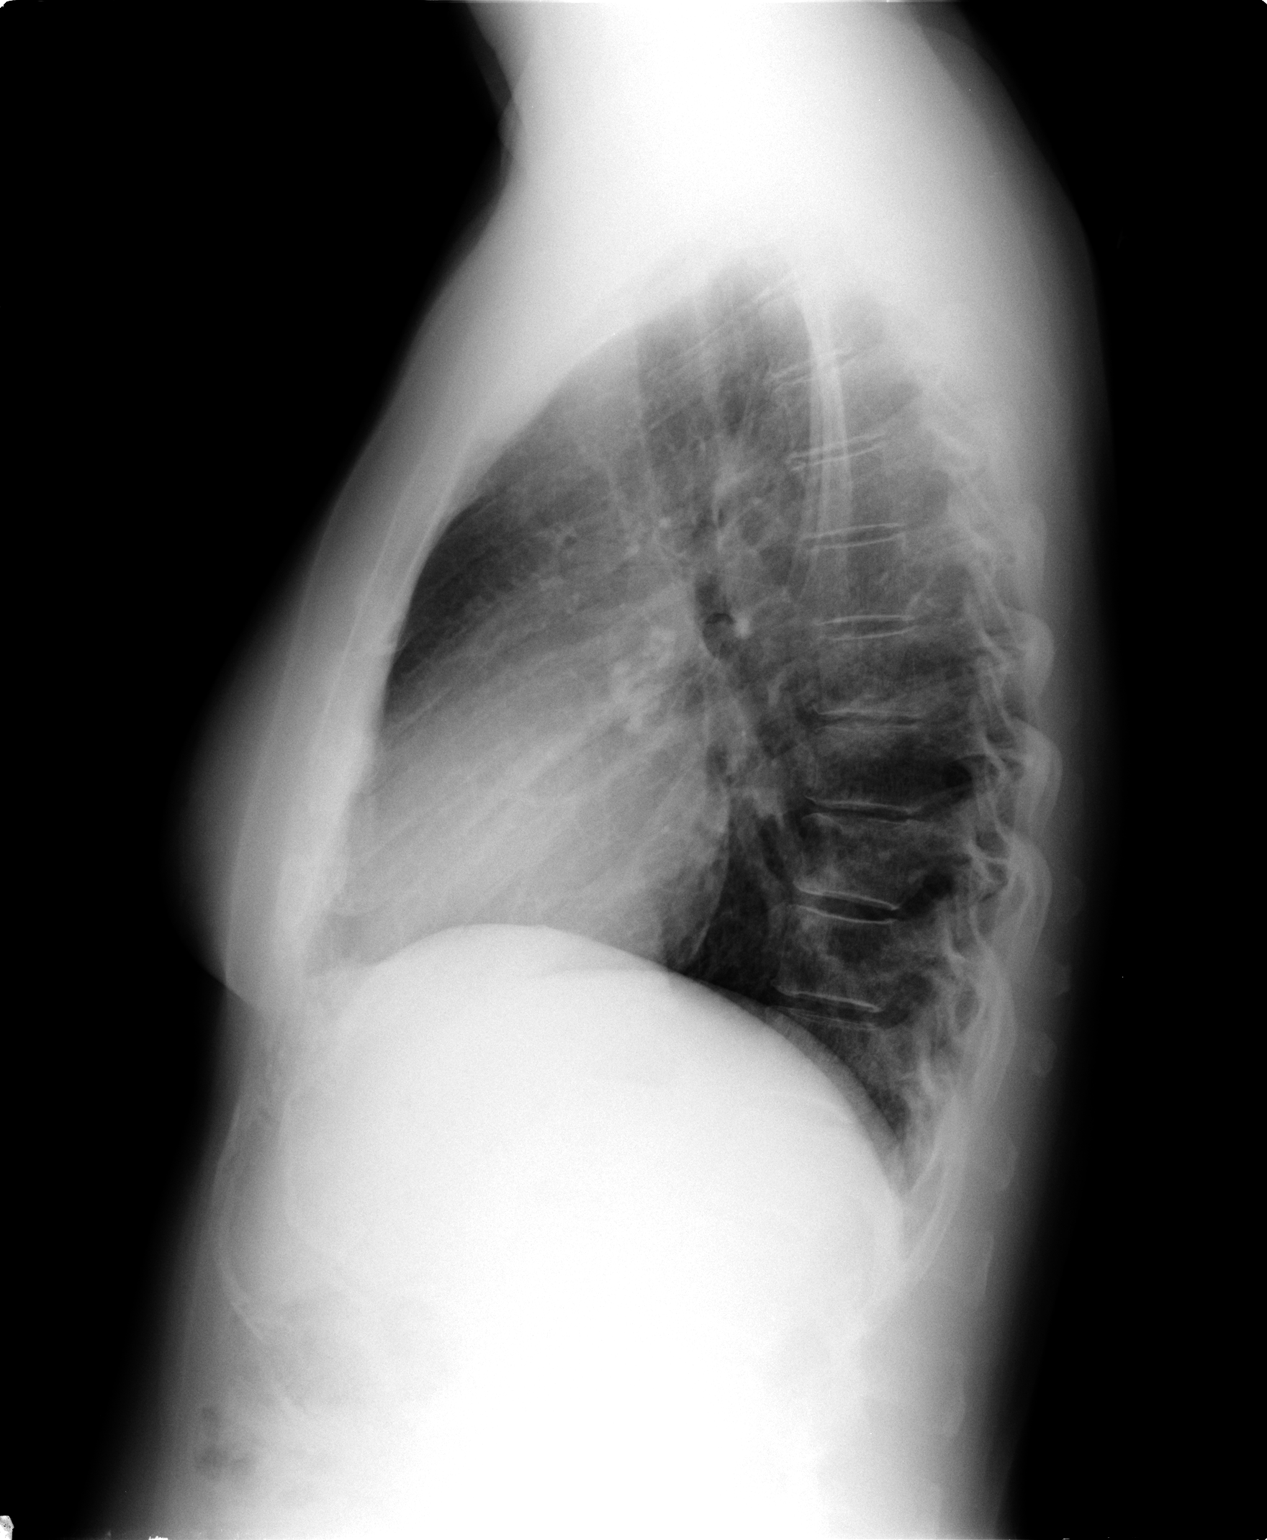

[2 of 2 positions shown; findings below may reference images not displayed]

FINDINGS: Cardiomediastinal silhouette is unremarkable.  No acute
infiltrate or pleural effusion.  No pulmonary edema.  Bony thorax
is unremarkable.
IMPRESSION: No active disease.

## 2014-09-10 ENCOUNTER — Encounter: Payer: Self-pay | Admitting: Family Medicine

## 2014-09-10 ENCOUNTER — Ambulatory Visit: Payer: Self-pay | Admitting: Family Medicine

## 2014-09-10 ENCOUNTER — Ambulatory Visit (INDEPENDENT_AMBULATORY_CARE_PROVIDER_SITE_OTHER): Payer: 59 | Admitting: Family Medicine

## 2014-09-10 VITALS — BP 102/80 | HR 78 | Temp 97.7°F | Ht 63.0 in | Wt 182.7 lb

## 2014-09-10 DIAGNOSIS — L255 Unspecified contact dermatitis due to plants, except food: Secondary | ICD-10-CM

## 2014-09-10 MED ORDER — PREDNISONE 10 MG PO TABS: ORAL_TABLET | ORAL | Status: DC

## 2014-09-10 NOTE — Progress Notes (Signed)
HPI:  ? Poison Ivy: -started a few days ago after clearing trees and vines in yard -reports hx allergy to poison ivy -itchy rash in streak across face involving the eyelid and on R thigh -she wore gloves, but thinks brushed hair out of face -no pain in eye, itching in eye, vision disturbance, fevers, SOB, swelling of throat or neck, lesions in eye or mucus membranes -has done steroids before and tolerated well  ROS: See pertinent positives and negatives per HPI.  No past medical history on file.  No past surgical history on file.  No family history on file.  History   Social History  . Marital Status: Married    Spouse Name: N/A  . Number of Children: N/A  . Years of Education: N/A   Social History Main Topics  . Smoking status: Never Smoker   . Smokeless tobacco: Not on file  . Alcohol Use: No  . Drug Use: No  . Sexual Activity: No   Other Topics Concern  . None   Social History Narrative     Current outpatient prescriptions:  .  acetaminophen (TYLENOL) 500 MG tablet, Take 1,000 mg by mouth every 6 (six) hours as needed for pain., Disp: , Rfl:  .  ibuprofen (ADVIL) 200 MG tablet, Take 600 mg by mouth every 6 (six) hours as needed for pain., Disp: , Rfl:  .  predniSONE (DELTASONE) 10 MG tablet, 5 tablets (50mg ) daily for 3 days, then 4 tablets (40 mg) daily for 3 days, then 3 tablets (30mg ) daily for 3 days, then 2 tablets (20 mg) daily for 3 days, then 1 tablet (10 mg) daily for 3 days. 45 tablets., Disp: 45 tablet, Rfl: 0  EXAM:  Filed Vitals:   09/10/14 1044  BP: 102/80  Pulse: 78  Temp: 97.7 F (36.5 C)    Body mass index is 32.37 kg/(m^2).  GENERAL: vitals reviewed and listed above, alert, oriented, appears well hydrated and in no acute distress  HEENT: atraumatic, conjunttiva clear, PERRLA, EOMI, visual acuity grossly intact, no obvious abnormalities on inspection of external nose and ears  NECK: no obvious masses on inspection  LUNGS: clear to  auscultation bilaterally, no wheezes, rales or rhonchi, good air movement  CV: HRRR, no peripheral edema  MS: moves all extremities without noticeable abnormality  SKIN: streak of vesiculpapular erythematous lesion across bilat face, R upper thigh with several patches of papulovesicular erythematous lesions  PSYCH: pleasant and cooperative, no obvious depression or anxiety  ASSESSMENT AND PLAN:  Discussed the following assessment and plan:  Toxicodendron dermatitis - Plan: predniSONE (DELTASONE) 10 MG tablet  -Patient advised to return or notify a doctor immediately if symptoms worsen or persist or new concerns arise.  Patient Instructions  -As we discussed, we have prescribed a new medication for you at this appointment. We discussed the common and serious potential adverse effects of this medication and you can review these and more with the pharmacist when you pick up your medication.  Please follow the instructions for use carefully and notify us immediately if you have any problems taking this medication.  Poison [employer] ivy is a inflammation of the skin (contact dermatitis) caused by touching the allergens on the leaves of the ivy plant following previous exposure to the plant. The rash usually appears 48 hours after exposure. The rash is usually bumps (papules) or blisters (vesicles) in a linear pattern. Depending on your own sensitivity, the rash may simply cause redness and itching, or it  may also progress to blisters which may break open. These must be well cared for to prevent secondary bacterial (germ) infection, followed by scarring. Keep any open areas dry, clean, dressed, and covered with an antibacterial ointment if needed. The eyes may also get puffy. The puffiness is worst in the morning and gets better as the day progresses. This dermatitis usually heals without scarring, within 2 to 3 weeks without treatment. HOME CARE INSTRUCTIONS  Thoroughly wash with soap and water  as soon as you have been exposed to poison ivy. You have about one half hour to remove the plant resin before it will cause the rash. This washing will destroy the oil or antigen on the skin that is causing, or will cause, the rash. Be sure to wash under your fingernails as any plant resin there will continue to spread the rash. Do not rub skin vigorously when washing affected area. Poison ivy cannot spread if no oil from the plant remains on your body. A rash that has progressed to weeping sores will not spread the rash unless you have not washed thoroughly. It is also important to wash any clothes you have been wearing as these may carry active allergens. The rash will return if you wear the unwashed clothing, even several days later. Avoidance of the plant in the future is the best measure. Poison ivy plant can be recognized by the number of leaves. Generally, poison ivy has three leaves with flowering branches on a single stem. Diphenhydramine may be purchased over the counter and used as needed for itching. Do not drive with this medication if it makes you drowsy.Ask your caregiver about medication for children. SEEK MEDICAL CARE IF:  Open sores develop.  Redness spreads beyond area of rash.  You notice purulent (pus-like) discharge.  You have increased pain.  Other signs of infection develop (such as fever). Document Released: 06/24/2000 Document Revised: 09/19/2011 Document Reviewed: 12/05/2008 [facility] Patient Information 2015 [place], [place]. This information is not intended to replace advice given to you by your health care provider. Make sure you discuss any questions you have with your health care provider.      Colin Benton R.

## 2014-09-10 NOTE — Progress Notes (Signed)
Pre visit review using our clinic review tool, if applicable. No additional management support is needed unless otherwise documented below in the visit note. 

## 2014-09-10 NOTE — Patient Instructions (Signed)
-  As we discussed, we have prescribed a new medication for you at this appointment. We discussed the common and serious potential adverse effects of this medication and you can review these and more with the pharmacist when you pick up your medication.  Please follow the instructions for use carefully and notify us immediately if you have any problems taking this medication.  Poison [employer] ivy is a inflammation of the skin (contact dermatitis) caused by touching the allergens on the leaves of the ivy plant following previous exposure to the plant. The rash usually appears 48 hours after exposure. The rash is usually bumps (papules) or blisters (vesicles) in a linear pattern. Depending on your own sensitivity, the rash may simply cause redness and itching, or it may also progress to blisters which may break open. These must be well cared for to prevent secondary bacterial (germ) infection, followed by scarring. Keep any open areas dry, clean, dressed, and covered with an antibacterial ointment if needed. The eyes may also get puffy. The puffiness is worst in the morning and gets better as the day progresses. This dermatitis usually heals without scarring, within 2 to 3 weeks without treatment. HOME CARE INSTRUCTIONS  Thoroughly wash with soap and water as soon as you have been exposed to poison ivy. You have about one half hour to remove the plant resin before it will cause the rash. This washing will destroy the oil or antigen on the skin that is causing, or will cause, the rash. Be sure to wash under your fingernails as any plant resin there will continue to spread the rash. Do not rub skin vigorously when washing affected area. Poison ivy cannot spread if no oil from the plant remains on your body. A rash that has progressed to weeping sores will not spread the rash unless you have not washed thoroughly. It is also important to wash any clothes you have been wearing as these may carry active allergens. The  rash will return if you wear the unwashed clothing, even several days later. Avoidance of the plant in the future is the best measure. Poison ivy plant can be recognized by the number of leaves. Generally, poison ivy has three leaves with flowering branches on a single stem. Diphenhydramine may be purchased over the counter and used as needed for itching. Do not drive with this medication if it makes you drowsy.Ask your caregiver about medication for children. SEEK MEDICAL CARE IF:  Open sores develop.  Redness spreads beyond area of rash.  You notice purulent (pus-like) discharge.  You have increased pain.  Other signs of infection develop (such as fever). Document Released: 06/24/2000 Document Revised: 09/19/2011 Document Reviewed: 12/05/2008 [facility] Patient Information 2015 [place], [place]. This information is not intended to replace advice given to you by your health care provider. Make sure you discuss any questions you have with your health care provider.

## 2016-06-14 ENCOUNTER — Other Ambulatory Visit: Payer: Self-pay | Admitting: Gynecology

## 2016-06-14 ENCOUNTER — Ambulatory Visit (INDEPENDENT_AMBULATORY_CARE_PROVIDER_SITE_OTHER): Payer: Commercial Managed Care - HMO | Admitting: Women's Health

## 2016-06-14 ENCOUNTER — Encounter: Payer: Self-pay | Admitting: Women's Health

## 2016-06-14 VITALS — BP 124/82 | Ht 63.0 in | Wt 185.0 lb

## 2016-06-14 DIAGNOSIS — Z1231 Encounter for screening mammogram for malignant neoplasm of breast: Secondary | ICD-10-CM

## 2016-06-14 DIAGNOSIS — N950 Postmenopausal bleeding: Secondary | ICD-10-CM

## 2016-06-14 DIAGNOSIS — Z01419 Encounter for gynecological examination (general) (routine) without abnormal findings: Secondary | ICD-10-CM | POA: Diagnosis not present

## 2016-06-14 DIAGNOSIS — Z13220 Encounter for screening for lipoid disorders: Secondary | ICD-10-CM | POA: Diagnosis not present

## 2016-06-14 LAB — LIPID PANEL
CHOL/HDL RATIO: 2.9 ratio (ref <5.0)
Cholesterol: 163 mg/dL (ref <200)
HDL: 56 mg/dL (ref >50)
LDL CALC: 93 mg/dL (ref <100)
Triglycerides: 70 mg/dL (ref <150)
VLDL: 14 mg/dL (ref <30)

## 2016-06-14 LAB — CBC WITH DIFFERENTIAL/PLATELET
BASOS ABS: 64 {cells}/uL (ref 0–200)
Basophils Relative: 1 %
EOS PCT: 1 %
Eosinophils Absolute: 64 cells/uL (ref 15–500)
HCT: 41.9 % (ref 35.0–45.0)
Hemoglobin: 13.6 g/dL (ref 11.7–15.5)
LYMPHS PCT: 26 %
Lymphs Abs: 1664 cells/uL (ref 850–3900)
MCH: 27.6 pg (ref 27.0–33.0)
MCHC: 32.5 g/dL (ref 32.0–36.0)
MCV: 85 fL (ref 80.0–100.0)
MONOS PCT: 5 %
MPV: 8.2 fL (ref 7.5–12.5)
Monocytes Absolute: 320 cells/uL (ref 200–950)
NEUTROS ABS: 4288 {cells}/uL (ref 1500–7800)
NEUTROS PCT: 67 %
PLATELETS: 300 10*3/uL (ref 140–400)
RBC: 4.93 MIL/uL (ref 3.80–5.10)
RDW: 13.5 % (ref 11.0–15.0)
WBC: 6.4 10*3/uL (ref 3.8–10.8)

## 2016-06-14 LAB — COMPREHENSIVE METABOLIC PANEL
ALT: 13 U/L (ref 6–29)
AST: 12 U/L (ref 10–35)
Albumin: 4 g/dL (ref 3.6–5.1)
Alkaline Phosphatase: 65 U/L (ref 33–130)
BUN: 12 mg/dL (ref 7–25)
CHLORIDE: 102 mmol/L (ref 98–110)
CO2: 27 mmol/L (ref 20–31)
CREATININE: 0.78 mg/dL (ref 0.50–1.05)
Calcium: 8.9 mg/dL (ref 8.6–10.4)
Glucose, Bld: 95 mg/dL (ref 65–99)
Potassium: 4 mmol/L (ref 3.5–5.3)
SODIUM: 137 mmol/L (ref 135–146)
Total Bilirubin: 0.4 mg/dL (ref 0.2–1.2)
Total Protein: 7 g/dL (ref 6.1–8.1)

## 2016-06-14 NOTE — Progress Notes (Signed)
Catherine Bailey Feb 03, 1966 [id number]    History:    Presents for annual exam.  Last office visit here 2013 has had minimal healthcare in the past few years reports busy. Last regular monthly cycle was October 2015 amenorrheic for 6 months then cycle April 2016 and then no bleeding until September 2017 and since then  Monthly cycles. Denies spotting between  cycles. Overdue for mammogram. Normal Pap history. Mother history of endometrial precancerous cells has had a hysterectomy. Occasional hot flushes, vaginal dryness.  Past medical history, past surgical history, family history and social history were all reviewed and documented in the EPIC chart. Works at [employer]. Daughter 19 doing well has had gardasil. Mother hypertension, father heart disease deceased.  ROS:  A ROS was performed and pertinent positives and negatives are included.  Exam:  Vitals:   06/14/16 1155  BP: 124/82  Weight: 185 lb (83.9 kg)  Height: 5\' 3"  (1.6 m)   Body mass index is 32.77 kg/m.   General appearance:  Normal Thyroid:  Symmetrical, normal in size, without palpable masses or nodularity. Respiratory  Auscultation:  Clear without wheezing or rhonchi Cardiovascular  Auscultation:  Regular rate, without rubs, murmurs or gallops  Edema/varicosities:  Not grossly evident Abdominal  Soft,nontender, without masses, guarding or rebound.  Liver/spleen:  No organomegaly noted  Hernia:  None appreciated  Skin  Inspection:  Grossly normal   Breasts: Examined lying and sitting.     Right: Without masses, retractions, discharge or axillary adenopathy.     Left: Without masses, retractions, discharge or axillary adenopathy. Gentitourinary   Inguinal/mons:  Normal without inguinal adenopathy  External genitalia:  Normal  BUS/Urethra/Skene's glands:  Normal  Vagina:  Normal  Cervix:  Normal  Uterus:  normal in size, shape and contour.  Midline and mobile  Adnexa/parametria:     Rt: Without masses or  tenderness.   Lt: Without masses or tenderness.  Anus and perineum: Normal  Digital rectal exam: Normal sphincter tone without palpated masses or tenderness  Assessment/Plan:  50 y.o. MWF G1 P1 for annual exam.     Postmenopausal bleeding Obesity  Plan: Instructed to schedule sonohysterogram with Dr. Phineas Real after next cycle. Reviewed will also check a endometrial biopsy at the same time. SBE's, instructed to schedule annual screening mammogram, breast center information given. Encouraged to increase regular exercise, decrease carbs in diet for weight loss, vitamin D 1000 daily encouraged. CBC, vitamin D, CMP, lipid panel, UA, Pap with HR HPV typing, new screening guidelines reviewed. Declined flu vaccine.  Catherine Bailey [facility], 12:56 PM 06/14/2016

## 2016-06-14 NOTE — Patient Instructions (Addendum)
Dr Carlean Purl, [facility] [phone]  colonoscopy Health Maintenance for Postmenopausal Women Introduction Menopause is a normal process in which your reproductive ability comes to an end. This process happens gradually over a span of months to years, usually between the ages of 38 and 52. Menopause is complete when you have missed 12 consecutive menstrual periods. It is important to talk with your health care provider about some of the most common conditions that affect postmenopausal women, such as heart disease, cancer, and bone loss (osteoporosis). Adopting a healthy lifestyle and getting preventive care can help to promote your health and wellness. Those actions can also lower your chances of developing some of these common conditions. What should I know about menopause? During menopause, you may experience a number of symptoms, such as:  Moderate-to-severe hot flashes.  Night sweats.  Decrease in sex drive.  Mood swings.  Headaches.  Tiredness.  Irritability.  Memory problems.  Insomnia. Choosing to treat or not to treat menopausal changes is an individual decision that you make with your health care provider. What should I know about hormone replacement therapy and supplements? Hormone therapy products are effective for treating symptoms that are associated with menopause, such as hot flashes and night sweats. Hormone replacement carries certain risks, especially as you become older. If you are thinking about using estrogen or estrogen with progestin treatments, discuss the benefits and risks with your health care provider. What should I know about heart disease and stroke? Heart disease, heart attack, and stroke become more likely as you age. This may be due, in part, to the hormonal changes that your body experiences during menopause. These can affect how your body processes dietary fats, triglycerides, and cholesterol. Heart attack and stroke are both medical emergencies. There are  many things that you can do to help prevent heart disease and stroke:  Have your blood pressure checked at least every 1-2 years. High blood pressure causes heart disease and increases the risk of stroke.  If you are 21-84 years old, ask your health care provider if you should take aspirin to prevent a heart attack or a stroke.  Do not use any tobacco products, including cigarettes, chewing tobacco, or electronic cigarettes. If you need help quitting, ask your health care provider.  It is important to eat a healthy diet and maintain a healthy weight.  Be sure to include plenty of vegetables, fruits, low-fat dairy products, and lean protein.  Avoid eating foods that are high in solid fats, added sugars, or salt (sodium).  Get regular exercise. This is one of the most important things that you can do for your health.  Try to exercise for at least 150 minutes each week. The type of exercise that you do should increase your heart rate and make you sweat. This is known as moderate-intensity exercise.  Try to do strengthening exercises at least twice each week. Do these in addition to the moderate-intensity exercise.  Know your numbers.Ask your health care provider to check your cholesterol and your blood glucose. Continue to have your blood tested as directed by your health care provider. What should I know about cancer screening? There are several types of cancer. Take the following steps to reduce your risk and to catch any cancer development as early as possible. Breast Cancer  Practice breast self-awareness.  This means understanding how your breasts normally appear and feel.  It also means doing regular breast self-exams. Let your health care provider know about any changes, no matter how  small.  If you are 40 or older, have a clinician do a breast exam (clinical breast exam or CBE) every year. Depending on your age, family history, and medical history, it may be recommended that you  also have a yearly breast X-ray (mammogram).  If you have a family history of breast cancer, talk with your health care provider about genetic screening.  If you are at high risk for breast cancer, talk with your health care provider about having an MRI and a mammogram every year.  Breast cancer (BRCA) gene test is recommended for women who have family members with BRCA-related cancers. Results of the assessment will determine the need for genetic counseling and BRCA1 and for BRCA2 testing. BRCA-related cancers include these types:  Breast. This occurs in males or females.  Ovarian.  Tubal. This may also be called fallopian tube cancer.  Cancer of the abdominal or pelvic lining (peritoneal cancer).  Prostate.  Pancreatic. Cervical, Uterine, and Ovarian Cancer  Your health care provider may recommend that you be screened regularly for cancer of the pelvic organs. These include your ovaries, uterus, and vagina. This screening involves a pelvic exam, which includes checking for microscopic changes to the surface of your cervix (Pap test).  For women ages 21-65, health care providers may recommend a pelvic exam and a Pap test every three years. For women ages 52-65, they may recommend the Pap test and pelvic exam, combined with testing for human papilloma virus (HPV), every five years. Some types of HPV increase your risk of cervical cancer. Testing for HPV may also be done on women of any age who have unclear Pap test results.  Other health care providers may not recommend any screening for nonpregnant women who are considered low risk for pelvic cancer and have no symptoms. Ask your health care provider if a screening pelvic exam is right for you.  If you have had past treatment for cervical cancer or a condition that could lead to cancer, you need Pap tests and screening for cancer for at least 20 years after your treatment. If Pap tests have been discontinued for you, your risk factors  (such as having a new sexual partner) need to be reassessed to determine if you should start having screenings again. Some women have medical problems that increase the chance of getting cervical cancer. In these cases, your health care provider may recommend that you have screening and Pap tests more often.  If you have a family history of uterine cancer or ovarian cancer, talk with your health care provider about genetic screening.  If you have vaginal bleeding after reaching menopause, tell your health care provider.  There are currently no reliable tests available to screen for ovarian cancer. Lung Cancer  Lung cancer screening is recommended for adults 2-13 years old who are at high risk for lung cancer because of a history of smoking. A yearly low-dose CT scan of the lungs is recommended if you:  Currently smoke.  Have a history of at least 30 pack-years of smoking and you currently smoke or have quit within the past 15 years. A pack-year is smoking an average of one pack of cigarettes per day for one year. Yearly screening should:  Continue until it has been 15 years since you quit.  Stop if you develop a health problem that would prevent you from having lung cancer treatment. Colorectal Cancer  This type of cancer can be detected and can often be prevented.  Routine colorectal cancer  screening usually begins at age 70 and continues through age 76.  If you have risk factors for colon cancer, your health care provider may recommend that you be screened at an earlier age.  If you have a family history of colorectal cancer, talk with your health care provider about genetic screening.  Your health care provider may also recommend using home test kits to check for hidden blood in your stool.  A small camera at the end of a tube can be used to examine your colon directly (sigmoidoscopy or colonoscopy). This is done to check for the earliest forms of colorectal cancer.  Direct  examination of the colon should be repeated every 5-10 years until age 47. However, if early forms of precancerous polyps or small growths are found or if you have a family history or genetic risk for colorectal cancer, you may need to be screened more often. Skin Cancer  Check your skin from head to toe regularly.  Monitor any moles. Be sure to tell your health care provider:  About any new moles or changes in moles, especially if there is a change in a mole's shape or color.  If you have a mole that is larger than the size of a pencil eraser.  If any of your family members has a history of skin cancer, especially at a Gershom Brobeck age, talk with your health care provider about genetic screening.  Always use sunscreen. Apply sunscreen liberally and repeatedly throughout the day.  Whenever you are outside, protect yourself by wearing long sleeves, pants, a wide-brimmed hat, and sunglasses. What should I know about osteoporosis? Osteoporosis is a condition in which bone destruction happens more quickly than new bone creation. After menopause, you may be at an increased risk for osteoporosis. To help prevent osteoporosis or the bone fractures that can happen because of osteoporosis, the following is recommended:  If you are 32-91 years old, get at least 1,000 mg of calcium and at least 600 mg of vitamin D per day.  If you are older than age 19 but younger than age 99, get at least 1,200 mg of calcium and at least 600 mg of vitamin D per day.  If you are older than age 9, get at least 1,200 mg of calcium and at least 800 mg of vitamin D per day. Smoking and excessive alcohol intake increase the risk of osteoporosis. Eat foods that are rich in calcium and vitamin D, and do weight-bearing exercises several times each week as directed by your health care provider. What should I know about how menopause affects my mental health? Depression may occur at any age, but it is more common as you become older.  Common symptoms of depression include:  Low or sad mood.  Changes in sleep patterns.  Changes in appetite or eating patterns.  Feeling an overall lack of motivation or enjoyment of activities that you previously enjoyed.  Frequent crying spells. Talk with your health care provider if you think that you are experiencing depression. What should I know about immunizations? It is important that you get and maintain your immunizations. These include:  Tetanus, diphtheria, and pertussis (Tdap) booster vaccine.  Influenza every year before the flu season begins.  Pneumonia vaccine.  Shingles vaccine. Your health care provider may also recommend other immunizations. This information is not intended to replace advice given to you by your health care provider. Make sure you discuss any questions you have with your health care provider. Document Released: 08/19/2005 Document  Revised: 01/15/2016 Document Reviewed: 03/31/2015  2017 [employer]  [phone]

## 2016-06-15 ENCOUNTER — Other Ambulatory Visit: Payer: Self-pay | Admitting: Women's Health

## 2016-06-15 LAB — URINALYSIS W MICROSCOPIC + REFLEX CULTURE
BACTERIA UA: NONE SEEN [HPF]
Bilirubin Urine: NEGATIVE
Casts: NONE SEEN [LPF]
Crystals: NONE SEEN [HPF]
GLUCOSE, UA: NEGATIVE
HGB URINE DIPSTICK: NEGATIVE
Ketones, ur: NEGATIVE
LEUKOCYTES UA: NEGATIVE
Nitrite: NEGATIVE
PROTEIN: NEGATIVE
RBC / HPF: NONE SEEN RBC/HPF (ref <=2)
SQUAMOUS EPITHELIAL / LPF: NONE SEEN [HPF] (ref <=5)
Specific Gravity, Urine: 1.023 (ref 1.001–1.035)
WBC, UA: NONE SEEN WBC/HPF (ref <=5)
YEAST: NONE SEEN [HPF]
pH: 6 (ref 5.0–8.0)

## 2016-06-15 LAB — PAP, TP IMAGING W/ HPV RNA, RFLX HPV TYPE 16,18/45: HPV MRNA, HIGH RISK: NOT DETECTED

## 2016-06-15 LAB — VITAMIN D 25 HYDROXY (VIT D DEFICIENCY, FRACTURES): Vit D, 25-Hydroxy: 27 ng/mL — ABNORMAL LOW (ref 30–100)

## 2016-06-15 MED ORDER — VITAMIN D (ERGOCALCIFEROL) 1.25 MG (50000 UNIT) PO CAPS: 50000.0000 [IU] | ORAL_CAPSULE | ORAL | 0 refills | Status: DC

## 2016-06-16 ENCOUNTER — Telehealth: Payer: Self-pay | Admitting: *Deleted

## 2016-06-16 NOTE — Telephone Encounter (Signed)
Per Edison Nasuti at [facility] ref [phone] all codes for [facility] covered 100% when performed in office. KW CMA

## 2016-06-27 ENCOUNTER — Other Ambulatory Visit: Payer: Self-pay | Admitting: Gynecology

## 2016-06-27 DIAGNOSIS — N950 Postmenopausal bleeding: Secondary | ICD-10-CM

## 2016-07-08 ENCOUNTER — Ambulatory Visit (INDEPENDENT_AMBULATORY_CARE_PROVIDER_SITE_OTHER): Payer: Commercial Managed Care - HMO

## 2016-07-08 ENCOUNTER — Encounter: Payer: Self-pay | Admitting: Gynecology

## 2016-07-08 ENCOUNTER — Ambulatory Visit (INDEPENDENT_AMBULATORY_CARE_PROVIDER_SITE_OTHER): Payer: Commercial Managed Care - HMO | Admitting: Gynecology

## 2016-07-08 VITALS — BP 126/80 | Ht 63.0 in | Wt 185.0 lb

## 2016-07-08 DIAGNOSIS — N840 Polyp of corpus uteri: Secondary | ICD-10-CM

## 2016-07-08 DIAGNOSIS — N950 Postmenopausal bleeding: Secondary | ICD-10-CM

## 2016-07-08 DIAGNOSIS — N926 Irregular menstruation, unspecified: Secondary | ICD-10-CM

## 2016-07-08 NOTE — Patient Instructions (Signed)
Office will call you with biopsy results. Office will call you to schedule surgery.

## 2016-07-08 NOTE — Progress Notes (Signed)
    Nahomy A Papadakis 13-Dec-1965 [id number]        50 y.o.  G1P0001 presents for sonohysterogram. History of 1-1/2 years of amenorrhea and then started having regular monthly menses in September. No prolonged or atypical bleeding in between. No hot flushes night sweats vaginal dryness.  Past medical history,surgical history, problem list, medications, allergies, family history and social history were all reviewed and documented in the EPIC chart.  Directed ROS with pertinent positives and negatives documented in the history of present illness/assessment and plan.  Exam: Sallee Lange assistant Vitals:   07/08/16 1044  Weight: 185 lb (83.9 kg)  Height: 5\' 3"  (1.6 m)   General appearance:  Normal Abdomen soft nontender without masses guarding rebound Pelvic external BUS vagina normal. Cervix normal. Uterus normal size midline mobile nontender. Adnexa without masses or tenderness.  Ultrasound:  Transabdominal and transvaginal. Uterus normal size with small 13 x 8 mm myoma. Endometrial echo 7.7 mm. Right ovary normal. Left ovary with echo-free thin-walled avascular cyst 25 x 19 x 19 mm. Cul-de-sac negative.  Sonohysterogram performed, sterile technique, single-tooth tenaculum anteriorly stabilization, disposable dilator required to allow for catheter placement, good distention with 40 x 12 x 18 mm endometrial polyp. Endometrial biopsy taken. Patient tolerated well.  Assessment/Plan:  50 y.o. G1P0001 with history of amenorrhea 1-1/2 years followed by monthly bleeding. No menopausal type symptoms. Will check baseline FSH now. Sonohysterogram consistent with large endometrial polyp. Reviewed with patient and recommendation to proceed with hysteroscopy, D&C with resection of the endometrial polyp.  I reviewed the proposed surgery with the patient to include the expected intraoperative and postoperative courses as well as the recovery period. The use of the hysteroscope, resectoscope and the D&C portion were  all discussed. The risks of surgery to include infection, prolonged antibiotics, hemorrhage necessitating transfusion and the risks of transfusion, including transfusion reaction, hepatitis, HIV, mad cow disease and other unknown entities were all discussed understood and accepted. The risk of damage to internal organs during the procedure, either immediately recognized or delay recognized, including vagina, cervix, uterus, possible perforation causing damage to bowel, bladder, ureters, vessels and nerves necessitating major exploratory reparative surgery and future reparative surgeries including bladder repair, ureteral damage repair, bowel resection, ostomy formation was also discussed understood and accepted. The potential for distended media absorption leading to metabolic complications such as fluid overload, coma and seizures was also discussed understood and accepted. The patient's questions were answered to her satisfaction and she will schedule her surgery in follow up for the biopsy results.Anastasio Auerbach MD, 11:20 AM 07/08/2016

## 2016-07-08 NOTE — Addendum Note (Signed)
Addended by: Burnett Kanaris on: 07/08/2016 12:19 PM   Modules accepted: Orders

## 2016-07-09 LAB — FOLLICLE STIMULATING HORMONE: FSH: 61 m[IU]/mL

## 2016-07-18 ENCOUNTER — Ambulatory Visit
Admission: RE | Admit: 2016-07-18 | Discharge: 2016-07-18 | Disposition: A | Discharge status: Home / Self Care | Payer: 59 | Source: Ambulatory Visit | Attending: Gynecology | Admitting: Gynecology

## 2016-07-18 DIAGNOSIS — Z1231 Encounter for screening mammogram for malignant neoplasm of breast: Secondary | ICD-10-CM

## 2016-07-20 ENCOUNTER — Other Ambulatory Visit: Payer: Self-pay | Admitting: Gynecology

## 2016-07-20 DIAGNOSIS — R928 Other abnormal and inconclusive findings on diagnostic imaging of breast: Secondary | ICD-10-CM

## 2016-07-22 ENCOUNTER — Ambulatory Visit
Admission: RE | Admit: 2016-07-22 | Discharge: 2016-07-22 | Disposition: A | Discharge status: Home / Self Care | Payer: 59 | Source: Ambulatory Visit | Attending: Gynecology | Admitting: Gynecology

## 2016-07-22 DIAGNOSIS — R928 Other abnormal and inconclusive findings on diagnostic imaging of breast: Secondary | ICD-10-CM

## 2016-07-26 ENCOUNTER — Ambulatory Visit (INDEPENDENT_AMBULATORY_CARE_PROVIDER_SITE_OTHER): Payer: Commercial Managed Care - HMO | Admitting: Gynecology

## 2016-07-26 ENCOUNTER — Encounter: Payer: Self-pay | Admitting: Gynecology

## 2016-07-26 VITALS — BP 118/76

## 2016-07-26 DIAGNOSIS — N840 Polyp of corpus uteri: Secondary | ICD-10-CM

## 2016-07-26 NOTE — Patient Instructions (Signed)
Followup for surgery as scheduled. 

## 2016-07-26 NOTE — H&P (Signed)
Catherine Bailey [birth year] [id number]   History and Physical  Chief complaint: Endometrial polyp  History of present illness: 51 y.o. G1P0001 with history of amenorrhea for approximately 1-1/2 years and then started having vaginal bleeding. FSH was 61.  Sonohysterogram showed a 40 x 12 x 18 mm endometrial polyp. Endometrial biopsy showed proliferative endometrium. The patient is admitted for hysteroscopy D&C with resection of the endometrial polyp.  Past medical history,surgical history, medications, allergies, family history and social history were all reviewed and documented in the EPIC chart.  ROS:  Was performed and pertinent positives and negatives are included in the history of present illness.  Exam:  Catherine Bailey assistant Vitals:   07/26/16 1506  BP: 118/76   General: well developed, well nourished female, no acute distress HEENT: normal  Lungs: clear to auscultation without wheezing, rales or rhonchi  Cardiac: regular rate without rubs, murmurs or gallops  Abdomen: soft, nontender without masses, guarding, rebound, organomegaly  Pelvic: external bus vagina: normal   Cervix: grossly normal  Uterus: normal size, midline and mobile, nontender  Adnexa: without masses or tenderness    Assessment/Plan:  51 y.o. G1P0001 with endometrial polyp for hysteroscopy D&C, resection of the endometrial polyp.  I again reviewed the proposed surgery with the patient to include the expected intraoperative and postoperative courses as well as the recovery period. The use of the hysteroscope, resectoscope and the D&C portion were all discussed. The risks of surgery to include infection, prolonged antibiotics, hemorrhage necessitating transfusion and the risks of transfusion, including transfusion reaction, hepatitis, HIV, mad cow disease and other unknown entities were all discussed understood and accepted. The risk of damage to internal organs during the procedure, either immediately recognized or delay  recognized, including vagina, cervix, uterus, possible perforation causing damage to bowel, bladder, ureters, vessels and nerves necessitating major exploratory reparative surgery and future reparative surgeries including bladder repair, ureteral damage repair, bowel resection, ostomy formation was also discussed understood and accepted. The potential for distended media absorption leading to metabolic complications such as fluid overload, coma and seizures was also discussed understood and accepted. She understands there are no guarantees that this will relieve her bleeding. The patient's questions were answered to her satisfaction and she is ready to proceed with surgery.     Catherine Auerbach MD, 3:21 PM 07/26/2016

## 2016-07-26 NOTE — Progress Notes (Signed)
Catherine Bailey [birth year] [id number]   Preoperative consult  Chief complaint: Endometrial polyp  History of present illness: 51 y.o. G1P0001 with history of amenorrhea for approximately 1-1/2 years and then started having vaginal bleeding. FSH was 61.  Sonohysterogram showed a 40 x 12 x 18 mm endometrial polyp. Endometrial biopsy showed proliferative endometrium. The patient is admitted for hysteroscopy D&C with resection of the endometrial polyp.  Past medical history,surgical history, medications, allergies, family history and social history were all reviewed and documented in the EPIC chart.  ROS:  Was performed and pertinent positives and negatives are included in the history of present illness.  Exam:  [name] assistant Vitals:   07/26/16 1506  BP: 118/76   General: well developed, well nourished female, no acute distress HEENT: normal  Lungs: clear to auscultation without wheezing, rales or rhonchi  Cardiac: regular rate without rubs, murmurs or gallops  Abdomen: soft, nontender without masses, guarding, rebound, organomegaly  Pelvic: external bus vagina: normal   Cervix: grossly normal  Uterus: normal size, midline and mobile, nontender  Adnexa: without masses or tenderness    Assessment/Plan:  51 y.o. G1P0001 with endometrial polyp for hysteroscopy D&C, resection of the endometrial polyp.  I again reviewed the proposed surgery with the patient to include the expected intraoperative and postoperative courses as well as the recovery period. The use of the hysteroscope, resectoscope and the D&C portion were all discussed. The risks of surgery to include infection, prolonged antibiotics, hemorrhage necessitating transfusion and the risks of transfusion, including transfusion reaction, hepatitis, HIV, mad cow disease and other unknown entities were all discussed understood and accepted. The risk of damage to internal organs during the procedure, either immediately recognized or delay  recognized, including vagina, cervix, uterus, possible perforation causing damage to bowel, bladder, ureters, vessels and nerves necessitating major exploratory reparative surgery and future reparative surgeries including bladder repair, ureteral damage repair, bowel resection, ostomy formation was also discussed understood and accepted. The potential for distended media absorption leading to metabolic complications such as fluid overload, coma and seizures was also discussed understood and accepted. She understands there are no guarantees that this will relieve her bleeding. The patient's questions were answered to her satisfaction and she is ready to proceed with surgery.    Anastasio Auerbach MD, 3:17 PM 07/26/2016

## 2016-08-01 ENCOUNTER — Encounter (HOSPITAL_BASED_OUTPATIENT_CLINIC_OR_DEPARTMENT_OTHER): Payer: Self-pay | Admitting: *Deleted

## 2016-08-01 NOTE — Progress Notes (Signed)
NPO AFTER MN.  ARRIVE AT 0600.  NEEDS HG AND URINE PREG.  

## 2016-08-04 ENCOUNTER — Ambulatory Visit (HOSPITAL_BASED_OUTPATIENT_CLINIC_OR_DEPARTMENT_OTHER): Payer: Commercial Managed Care - HMO | Admitting: Anesthesiology

## 2016-08-04 ENCOUNTER — Encounter (HOSPITAL_BASED_OUTPATIENT_CLINIC_OR_DEPARTMENT_OTHER): Payer: Self-pay

## 2016-08-04 ENCOUNTER — Encounter (HOSPITAL_BASED_OUTPATIENT_CLINIC_OR_DEPARTMENT_OTHER)
Admission: RE | Disposition: A | Discharge status: Home / Self Care | Payer: Self-pay | Source: Ambulatory Visit | Attending: Gynecology

## 2016-08-04 ENCOUNTER — Ambulatory Visit (HOSPITAL_BASED_OUTPATIENT_CLINIC_OR_DEPARTMENT_OTHER)
Admission: RE | Admit: 2016-08-04 | Discharge: 2016-08-04 | Disposition: A | Discharge status: Home / Self Care | Payer: Commercial Managed Care - HMO | Source: Ambulatory Visit | Attending: Gynecology | Admitting: Gynecology

## 2016-08-04 DIAGNOSIS — N840 Polyp of corpus uteri: Secondary | ICD-10-CM | POA: Insufficient documentation

## 2016-08-04 DIAGNOSIS — N950 Postmenopausal bleeding: Secondary | ICD-10-CM | POA: Insufficient documentation

## 2016-08-04 HISTORY — PX: DILATATION & CURETTAGE/HYSTEROSCOPY WITH MYOSURE: SHX6511

## 2016-08-04 HISTORY — DX: Irregular menstruation, unspecified: N92.6

## 2016-08-04 HISTORY — DX: Polyp of corpus uteri: N84.0

## 2016-08-04 LAB — POCT HEMOGLOBIN-HEMACUE
HEMOGLOBIN: 14.2 g/dL (ref 12.0–15.0)
Hemoglobin: 14.2 g/dL (ref 12.0–15.0)

## 2016-08-04 LAB — POCT PREGNANCY, URINE: PREG TEST UR: NEGATIVE

## 2016-08-04 SURGERY — DILATATION & CURETTAGE/HYSTEROSCOPY WITH MYOSURE
Anesthesia: General | Site: Uterus

## 2016-08-04 SURGERY — Surgical Case
Anesthesia: *Unknown

## 2016-08-04 MED ORDER — CEFOTETAN DISODIUM-DEXTROSE 2-2.08 GM-% IV SOLR
INTRAVENOUS | Status: AC
  Filled 2016-08-04: qty 50

## 2016-08-04 MED ORDER — ONDANSETRON HCL 4 MG/2ML IJ SOLN
INTRAMUSCULAR | Status: AC
Start: 2016-08-04 — End: 2016-08-04
  Filled 2016-08-04: qty 2

## 2016-08-04 MED ORDER — SODIUM CHLORIDE 0.9 % IR SOLN
Status: DC | PRN
  Administered 2016-08-04: 1500 mL

## 2016-08-04 MED ORDER — MIDAZOLAM HCL 5 MG/5ML IJ SOLN
INTRAMUSCULAR | Status: DC | PRN
  Administered 2016-08-04: 2 mg via INTRAVENOUS

## 2016-08-04 MED ORDER — OXYCODONE HCL 5 MG/5ML PO SOLN
5.0000 mg | Freq: Once | ORAL | Status: DC | PRN
  Filled 2016-08-04: qty 5

## 2016-08-04 MED ORDER — KETOROLAC TROMETHAMINE 30 MG/ML IJ SOLN
INTRAMUSCULAR | Status: DC | PRN
  Administered 2016-08-04: 30 mg via INTRAVENOUS

## 2016-08-04 MED ORDER — FENTANYL CITRATE (PF) 100 MCG/2ML IJ SOLN
INTRAMUSCULAR | Status: DC | PRN
  Administered 2016-08-04 (×4): 25 ug via INTRAVENOUS

## 2016-08-04 MED ORDER — DEXAMETHASONE SODIUM PHOSPHATE 10 MG/ML IJ SOLN
INTRAMUSCULAR | Status: AC
Start: 2016-08-04 — End: 2016-08-04
  Filled 2016-08-04: qty 1

## 2016-08-04 MED ORDER — FENTANYL CITRATE (PF) 100 MCG/2ML IJ SOLN
25.0000 ug | INTRAMUSCULAR | Status: DC | PRN
  Filled 2016-08-04: qty 1

## 2016-08-04 MED ORDER — OXYCODONE HCL 5 MG PO TABS
5.0000 mg | ORAL_TABLET | Freq: Once | ORAL | Status: DC | PRN
  Filled 2016-08-04: qty 1

## 2016-08-04 MED ORDER — FENTANYL CITRATE (PF) 100 MCG/2ML IJ SOLN
INTRAMUSCULAR | Status: AC
  Filled 2016-08-04: qty 2

## 2016-08-04 MED ORDER — CEFOTETAN DISODIUM 2 G IJ SOLR
2.0000 g | INTRAMUSCULAR | Status: AC
  Administered 2016-08-04: 2 g via INTRAVENOUS
  Filled 2016-08-04: qty 2

## 2016-08-04 MED ORDER — PROPOFOL 10 MG/ML IV BOLUS
INTRAVENOUS | Status: AC
  Filled 2016-08-04: qty 40

## 2016-08-04 MED ORDER — LIDOCAINE HCL 1 % IJ SOLN
INTRAMUSCULAR | Status: DC | PRN
  Administered 2016-08-04: 10 mL

## 2016-08-04 MED ORDER — LIDOCAINE HCL (CARDIAC) 20 MG/ML IV SOLN
INTRAVENOUS | Status: DC | PRN
  Administered 2016-08-04: 80 mg via INTRAVENOUS

## 2016-08-04 MED ORDER — LIDOCAINE HCL 1 % IJ SOLN
INTRAMUSCULAR | Status: AC
  Filled 2016-08-04: qty 20

## 2016-08-04 MED ORDER — ONDANSETRON HCL 4 MG/2ML IJ SOLN
4.0000 mg | Freq: Four times a day (QID) | INTRAMUSCULAR | Status: DC | PRN
  Filled 2016-08-04: qty 2

## 2016-08-04 MED ORDER — KETOROLAC TROMETHAMINE 30 MG/ML IJ SOLN
INTRAMUSCULAR | Status: AC
  Filled 2016-08-04: qty 1

## 2016-08-04 MED ORDER — PROPOFOL 10 MG/ML IV BOLUS
INTRAVENOUS | Status: DC | PRN
  Administered 2016-08-04: 200 mg via INTRAVENOUS

## 2016-08-04 MED ORDER — METOCLOPRAMIDE HCL 5 MG/ML IJ SOLN
INTRAMUSCULAR | Status: AC
  Filled 2016-08-04: qty 2

## 2016-08-04 MED ORDER — OXYCODONE-ACETAMINOPHEN 5-325 MG PO TABS: 1.0000 | ORAL_TABLET | ORAL | 0 refills | Status: DC | PRN

## 2016-08-04 MED ORDER — METOCLOPRAMIDE HCL 5 MG/ML IJ SOLN
INTRAMUSCULAR | Status: DC | PRN
  Administered 2016-08-04: 10 mg via INTRAVENOUS

## 2016-08-04 MED ORDER — ONDANSETRON HCL 4 MG/2ML IJ SOLN
INTRAMUSCULAR | Status: DC | PRN
  Administered 2016-08-04: 4 mg via INTRAVENOUS

## 2016-08-04 MED ORDER — DEXAMETHASONE SODIUM PHOSPHATE 4 MG/ML IJ SOLN
INTRAMUSCULAR | Status: DC | PRN
  Administered 2016-08-04: 10 mg via INTRAVENOUS

## 2016-08-04 MED ORDER — LIDOCAINE 2% (20 MG/ML) 5 ML SYRINGE
INTRAMUSCULAR | Status: AC
  Filled 2016-08-04: qty 5

## 2016-08-04 MED ORDER — MIDAZOLAM HCL 2 MG/2ML IJ SOLN
INTRAMUSCULAR | Status: AC
  Filled 2016-08-04: qty 2

## 2016-08-04 MED ORDER — LACTATED RINGERS IV SOLN
INTRAVENOUS | Status: DC
  Administered 2016-08-04 (×2): via INTRAVENOUS
  Filled 2016-08-04: qty 1000

## 2016-08-04 SURGICAL SUPPLY — 36 items
BAG DECANTER FOR FLEXI CONT (MISCELLANEOUS) IMPLANT
CANISTER SUCTION 2500CC (MISCELLANEOUS) ×3 IMPLANT
CATH ROBINSON RED A/P 16FR (CATHETERS) ×3 IMPLANT
COVER BACK TABLE 60X90IN (DRAPES) ×3 IMPLANT
DEVICE MYOSURE LITE (MISCELLANEOUS) ×3 IMPLANT
DEVICE MYOSURE REACH (MISCELLANEOUS) IMPLANT
DILATOR CANAL MILEX (MISCELLANEOUS) IMPLANT
DRAPE HYSTEROSCOPY (DRAPE) ×3 IMPLANT
DRAPE LG THREE QUARTER DISP (DRAPES) ×3 IMPLANT
DRSG TELFA 3X8 NADH (GAUZE/BANDAGES/DRESSINGS) ×3 IMPLANT
ELECT REM PT RETURN 9FT ADLT (ELECTROSURGICAL) ×3
ELECTRODE REM PT RTRN 9FT ADLT (ELECTROSURGICAL) ×1 IMPLANT
FILTER ARTHROSCOPY CONVERTOR (FILTER) ×3 IMPLANT
GLOVE BIO SURGEON STRL SZ 6.5 (GLOVE) ×2 IMPLANT
GLOVE BIO SURGEON STRL SZ7.5 (GLOVE) ×6 IMPLANT
GLOVE BIO SURGEONS STRL SZ 6.5 (GLOVE) ×1
GLOVE BIOGEL PI IND STRL 6.5 (GLOVE) ×1 IMPLANT
GLOVE BIOGEL PI INDICATOR 6.5 (GLOVE) ×2
GOWN STRL REUS W/TWL LRG LVL3 (GOWN DISPOSABLE) ×6 IMPLANT
JUMPSUIT BLUE BOOT COVER DISP (PROTECTIVE WEAR) IMPLANT
KIT ROOM TURNOVER WOR (KITS) ×3 IMPLANT
LEGGING LITHOTOMY PAIR STRL (DRAPES) ×3 IMPLANT
NDL SAFETY ECLIPSE 18X1.5 (NEEDLE) IMPLANT
NEEDLE HYPO 18GX1.5 SHARP (NEEDLE)
NEEDLE SPNL 22GX3.5 QUINCKE BK (NEEDLE) ×3 IMPLANT
PACK BASIN DAY SURGERY FS (CUSTOM PROCEDURE TRAY) ×3 IMPLANT
PAD OB MATERNITY 4.3X12.25 (PERSONAL CARE ITEMS) ×3 IMPLANT
PAD PREP 24X48 CUFFED NSTRL (MISCELLANEOUS) ×3 IMPLANT
SEAL ROD LENS SCOPE MYOSURE (ABLATOR) ×3 IMPLANT
SYR CONTROL 10ML LL (SYRINGE) ×3 IMPLANT
SYRINGE LUER LOK 1CC (MISCELLANEOUS) IMPLANT
TOWEL OR 17X24 6PK STRL BLUE (TOWEL DISPOSABLE) ×6 IMPLANT
TRAY DSU PREP LF (CUSTOM PROCEDURE TRAY) ×3 IMPLANT
TUBING AQUILEX INFLOW (TUBING) ×3 IMPLANT
TUBING AQUILEX OUTFLOW (TUBING) ×3 IMPLANT
WATER STERILE IRR 500ML POUR (IV SOLUTION) IMPLANT

## 2016-08-04 NOTE — H&P (Signed)
The patient was examined.  I reviewed the proposed surgery and consent form with the patient.  The dictated history and physical is current and accurate and all questions were answered. The patient is ready to proceed with surgery and has a realistic understanding and expectation for the outcome.   Anastasio Auerbach MD, 7:04 AM 08/04/2016

## 2016-08-04 NOTE — Op Note (Signed)
Gailya A Pine Jul 17, 1965 [id number]   Post Operative Note   Date of surgery:  08/04/2016  Pre Op Dx:  Postmenopausal bleeding, endometrial polyp  Post Op Dx:  Postmenopausal bleeding, endometrial polyp  Procedure:  Hysteroscopy, D&C, Myosure resection of endometrial polyp 2  Surgeon:  Donalynn Furlong P  Anesthesia:  General  EBL:  Minimal  Distended media discrepancy:  [id number] cc saline  Complications:  None  Specimen:  #1 endometrial polyp fragments #2 endometrial curetting to pathology  Findings: EUA:  External BUS vagina normal. Cervix normal. Uterus normal size midline mobile. Adnexa without masses   Hysteroscopy:  Adequate noting fundus, right/left tubal ostia, anterior/posterior endometrial surfaces, lower uterine segment and endocervical canal all visualized. Large fingerlike endometrial polyp from posterior fundal surface to upper endocervical canal. Smaller endometrial polyp posterior right upper endometrial surface. Both polyps resected in their entirety to the level the surrounding endometrium.  Procedure:  The patient was taken to the operating room, was placed in the low dorsal lithotomy position, underwent general anesthesia, received a perineal/vaginal preparation with Betadine solution and the bladder was emptied with in and out Foley catheterization. The timeout was performed by the surgical team. An EUA was performed. The patient was draped in the usual fashion. The cervix was visualized with a speculum, anterior lip grasped with a single-tooth tenaculum and a paracervical block was placed using 10 cc's of 1% lidocaine. The cervix was gently dilated to admit the Myosure hysteroscope and hysteroscopy was performed with findings noted above. Using the Myosure Lite resectoscopic wand the polyps were resected in their entirety to the level the surrounding endometrium. A gentle sharp curettage was performed. Both specimens were sent separately to pathology.  Repeat hysteroscopy  showed an empty cavity with good distention and no evidence of perforation. The instruments were removed and adequate hemostasis was visualized at the tenaculum site and external cervical os. The patient was given intraoperative Toradol, was awakened without difficulty and was taken to the recovery room in good condition having tolerated the procedure well.      Anastasio Auerbach MD, 8:00 AM 08/04/2016

## 2016-08-04 NOTE — Anesthesia Preprocedure Evaluation (Signed)
Anesthesia Evaluation  Patient identified by MRN, date of birth, ID band Patient awake    Reviewed: Allergy & Precautions, H&P , NPO status , Patient's Chart, lab work & pertinent test results  Airway Mallampati: II   Neck ROM: full    Dental   Pulmonary neg pulmonary ROS,    breath sounds clear to auscultation       Cardiovascular negative cardio ROS   Rhythm:regular Rate:Normal     Neuro/Psych    GI/Hepatic   Endo/Other  obese  Renal/GU      Musculoskeletal   Abdominal   Peds  Hematology   Anesthesia Other Findings   Reproductive/Obstetrics Endometrial polyp.  Irregular menses                             Anesthesia Physical Anesthesia Plan  ASA: II  Anesthesia Plan: General   Post-op Pain Management:    Induction: Intravenous  Airway Management Planned: LMA  Additional Equipment:   Intra-op Plan:   Post-operative Plan:   Informed Consent: I have reviewed the patients History and Physical, chart, labs and discussed the procedure including the risks, benefits and alternatives for the proposed anesthesia with the patient or authorized representative who has indicated his/her understanding and acceptance.     Plan Discussed with: CRNA, Anesthesiologist and Surgeon  Anesthesia Plan Comments:         Anesthesia Quick Evaluation

## 2016-08-04 NOTE — Anesthesia Postprocedure Evaluation (Signed)
Anesthesia Post Note  Patient: Catherine Bailey  Procedure(s) Performed: Procedure(s) (LRB): DILATATION & CURETTAGE/HYSTEROSCOPY WITH MYOSURE (N/A)  Patient location during evaluation: PACU Anesthesia Type: General Level of consciousness: awake and alert and patient cooperative Pain management: pain level controlled Vital Signs Assessment: post-procedure vital signs reviewed and stable Respiratory status: spontaneous breathing and respiratory function stable Cardiovascular status: stable Anesthetic complications: no       Last Vitals:  Vitals:   08/04/16 0845 08/04/16 0900  BP: 125/77 121/72  Pulse: 67 65  Resp: 14 16  Temp:      Last Pain:  Vitals:   08/04/16 0900  TempSrc:   PainSc: 2                  Suhaila Troiano S

## 2016-08-04 NOTE — Anesthesia Procedure Notes (Signed)
Procedure Name: LMA Insertion Date/Time: 08/04/2016 7:38 AM Performed by: Justice Rocher Pre-anesthesia Checklist: Patient identified, Emergency Drugs available, Suction available and Patient being monitored Patient Re-evaluated:Patient Re-evaluated prior to inductionOxygen Delivery Method: Circle system utilized Preoxygenation: Pre-oxygenation with 100% oxygen Intubation Type: IV induction Ventilation: Mask ventilation without difficulty LMA: LMA inserted LMA Size: 4.0 Number of attempts: 1 Airway Equipment and Method: Bite block Placement Confirmation: positive ETCO2 Tube secured with: Tape Dental Injury: Teeth and Oropharynx as per pre-operative assessment

## 2016-08-04 NOTE — Discharge Instructions (Signed)
°  Post Anesthesia Home Care Instructions ° °Activity: °Get plenty of rest for the remainder of the day. A responsible adult should stay with you for 24 hours following the procedure.  °For the next 24 hours, DO NOT: °-Drive a car °-Operate machinery °-Drink alcoholic beverages °-Take any medication unless instructed by your physician °-Make any legal decisions or sign important papers. ° °Meals: °Start with liquid foods such as gelatin or soup. Progress to regular foods as tolerated. Avoid greasy, spicy, heavy foods. If nausea and/or vomiting occur, drink only clear liquids until the nausea and/or vomiting subsides. Call your physician if vomiting continues. ° °Special Instructions/Symptoms: °Your throat may feel dry or sore from the anesthesia or the breathing tube placed in your throat during surgery. If this causes discomfort, gargle with warm salt water. The discomfort should disappear within 24 hours. ° °If you had a scopolamine patch placed behind your ear for the management of post- operative nausea and/or vomiting: ° °1. The medication in the patch is effective for 72 hours, after which it should be removed.  Wrap patch in a tissue and discard in the trash. Wash hands thoroughly with soap and water. °2. You may remove the patch earlier than 72 hours if you experience unpleasant side effects which may include dry mouth, dizziness or visual disturbances. °3. Avoid touching the patch. Wash your hands with soap and water after contact with the patch. °  ° ° °Postoperative Instructions Hysteroscopy D & C ° °Dr. Fontaine and the nursing staff have discussed postoperative instructions with you.  If you have any questions please ask them before you leave the hospital, or call Dr Fontaine’s office at [phone].   ° °We would like to emphasize the following instructions: ° ° °? Call the office to make your follow-up appointment as recommended by Dr Fontaine (usually 1-2 weeks). ° °? You were given a prescription,  or one was ordered for you at the pharmacy you designated.  Get that prescription filled and take the medication according to instructions. ° °? You may eat a regular diet, but slowly until you start having bowel movements. ° °? Drink plenty of water daily. ° °? Nothing in the vagina (intercourse, douching, objects of any kind) for two weeks.  When reinitiating intercourse, if it is uncomfortable, stop and make an appointment with Dr Fontaine to be evaluated. ° °? No driving for one to two days until the effects of anesthesia has worn off.  No traveling out of town for several days. ° °? You may shower, but no baths for one week.  Walking up and down stairs is ok.  No heavy lifting, prolonged standing, repeated bending or any “working out” until your post op check. ° °? Rest frequently, listen to your body and do not push yourself and overdo it. ° °? Call if: ° °o Your pain medication does not seem strong enough. °o Worsening pain or abdominal bloating °o Persistent nausea or vomiting °o Difficulty with urination or bowel movements. °o Temperature of 101 degrees or higher. °o Heavy vaginal bleeding.  If your period is due, you may use tampons. °o You have any questions or concerns ° ° ° ° °

## 2016-08-04 NOTE — Transfer of Care (Signed)
Immediate Anesthesia Transfer of Care Note  Patient: Catherine Bailey  Procedure(s) Performed: Procedure(s) (LRB): DILATATION & CURETTAGE/HYSTEROSCOPY WITH MYOSURE (N/A)  Patient Location: PACU  Anesthesia Type: General  Level of Consciousness: awake, sedated, patient cooperative and responds to stimulation  Airway & Oxygen Therapy: Patient Spontanous Breathing and Patient connected to face mask oxygen  Post-op Assessment: Report given to PACU RN, Post -op Vital signs reviewed and stable and Patient moving all extremities  Post vital signs: Reviewed and stable  Complications: No apparent anesthesia complications

## 2016-08-05 ENCOUNTER — Encounter (HOSPITAL_BASED_OUTPATIENT_CLINIC_OR_DEPARTMENT_OTHER): Payer: Self-pay | Admitting: Gynecology

## 2016-08-18 ENCOUNTER — Ambulatory Visit (INDEPENDENT_AMBULATORY_CARE_PROVIDER_SITE_OTHER): Payer: Commercial Managed Care - HMO | Admitting: Gynecology

## 2016-08-18 ENCOUNTER — Encounter: Payer: Self-pay | Admitting: Gynecology

## 2016-08-18 VITALS — BP 110/70

## 2016-08-18 DIAGNOSIS — Z9889 Other specified postprocedural states: Secondary | ICD-10-CM

## 2016-08-18 DIAGNOSIS — N8501 Benign endometrial hyperplasia: Secondary | ICD-10-CM

## 2016-08-18 NOTE — Progress Notes (Signed)
    Catherine Bailey 09-May-1966 [id number]        51 y.o.  G1P0001 presents for her postoperative visit status post hysteroscopic resection endometrial polyps. Has done well since then without bleeding.  Past medical history,surgical history, problem list, medications, allergies, family history and social history were all reviewed and documented in the EPIC chart.  Directed ROS with pertinent positives and negatives documented in the history of present illness/assessment and plan.  Exam: Deboraha Sprang Vitals:   08/18/16 1230  BP: 110/70   General appearance:  Normal Abdomen soft nontender without masses guarding rebound Pelvic external BUS vagina with atrophic changes. Cervix grossly normal. Uterus normal size midline mobile nontender. Adnexa without masses or tenderness.  Assessment/Plan:  51 y.o. G1P0001  with recent resection of endometrial polyps doing well. She has a history of no menses for approximately 1-1/2 years and then started having vaginal bleeding. Her [place] was 60. Sonohysterogram showed endometrial polyp with endometrial biopsy showing proliferative endometrium. Pathology from the hysteroscopy D&C showed inactive endometrium on the uterine curettings and endometrial hyperplasia both simple and complex without atypia in the endometrial polyp specimen. She had 2 polyps visualized during the procedure both of which were resected in their entirety. I reviewed the whole issue of endometrial hyperplasia to include simple versus complex with and without atypia and the precancerous potential particularly with complex hyperplasia with atypia. Given that the hyperplasia appears to be confined to the polyp which was resected and the surrounding endometrium showed inactive changes my recommendation would be to follow up in a year with sonohysterogram to relook at the cavity and resampled cavity as long as this remains consistent with menopausal changes and no hyperplasia them will follow  expectantly. The patient feels comfortable with this. She'll report any bleeding in the interim. She is due for her annual in December and will follow up for this but then schedule the sonohysterogram for February March timeframe.    Anastasio Auerbach MD, 12:45 PM 08/18/2016

## 2016-08-18 NOTE — Patient Instructions (Signed)
Follow up for your annual exam in December when you are due.  Follow up for the sonohysterogram ultrasound in a year to follow up on the complex hyperplastic polyps

## 2016-09-04 ENCOUNTER — Other Ambulatory Visit: Payer: Self-pay | Admitting: Women's Health

## 2016-11-23 ENCOUNTER — Encounter: Payer: Self-pay | Admitting: Gynecology

## 2017-06-19 ENCOUNTER — Encounter: Payer: Commercial Managed Care - HMO | Admitting: Women's Health

## 2017-06-22 ENCOUNTER — Other Ambulatory Visit: Payer: Self-pay | Admitting: Women's Health

## 2017-06-22 ENCOUNTER — Encounter: Payer: Self-pay | Admitting: Women's Health

## 2017-06-22 ENCOUNTER — Ambulatory Visit (INDEPENDENT_AMBULATORY_CARE_PROVIDER_SITE_OTHER): Payer: 59 | Admitting: Women's Health

## 2017-06-22 VITALS — BP 124/80 | Ht 63.0 in | Wt 190.0 lb

## 2017-06-22 DIAGNOSIS — Z13220 Encounter for screening for lipoid disorders: Secondary | ICD-10-CM | POA: Diagnosis not present

## 2017-06-22 DIAGNOSIS — Z1231 Encounter for screening mammogram for malignant neoplasm of breast: Secondary | ICD-10-CM

## 2017-06-22 DIAGNOSIS — Z01419 Encounter for gynecological examination (general) (routine) without abnormal findings: Secondary | ICD-10-CM | POA: Diagnosis not present

## 2017-06-22 NOTE — Patient Instructions (Addendum)
[address]ba[address] Dr Carlean Purl  [phone]  Carbohydrate Counting for Diabetes Mellitus, Adult Carbohydrate counting is a method for keeping track of how many carbohydrates you eat. Eating carbohydrates naturally increases the amount of sugar (glucose) in the blood. Counting how many carbohydrates you eat helps keep your blood glucose within normal limits, which helps you manage your diabetes (diabetes mellitus). It is important to know how many carbohydrates you can safely have in each meal. This is different for every person. A diet and nutrition specialist (registered dietitian) can help you make a meal plan and calculate how many carbohydrates you should have at each meal and snack. Carbohydrates are found in the following foods:  Grains, such as breads and cereals.  Dried beans and soy products.  Starchy vegetables, such as potatoes, peas, and corn.  Fruit and fruit juices.  Milk and yogurt.  Sweets and snack foods, such as cake, cookies, candy, chips, and soft drinks.  How do I count carbohydrates? There are two ways to count carbohydrates in food. You can use either of the methods or a combination of both. Reading "Nutrition Facts" on packaged food The "Nutrition Facts" list is included on the labels of almost all packaged foods and beverages in the [place] It includes:  The serving size.  Information about nutrients in each serving, including the grams (g) of carbohydrate per serving.  To use the "Nutrition Facts":  Decide how many servings you will have.  Multiply the number of servings by the number of carbohydrates per serving.  The resulting number is the total amount of carbohydrates that you will be having.  Learning standard serving sizes of other foods When you eat foods containing carbohydrates that are not packaged or do not include "Nutrition Facts" on the label, you need to measure the servings in order to count the amount of carbohydrates:  Measure the foods that you will  eat with a food scale or measuring cup, if needed.  Decide how many standard-size servings you will eat.  Multiply the number of servings by 15. Most carbohydrate-rich foods have about 15 g of carbohydrates per serving. ? For example, if you eat 8 oz (170 g) of strawberries, you will have eaten 2 servings and 30 g of carbohydrates (2 servings x 15 g = 30 g).  For foods that have more than one food mixed, such as soups and casseroles, you must count the carbohydrates in each food that is included.  The following list contains standard serving sizes of common carbohydrate-rich foods. Each of these servings has about 15 g of carbohydrates:   hamburger bun or  English muffin.   oz (15 mL) syrup.   oz (14 g) jelly.  1 slice of bread.  1 six-inch tortilla.  3 oz (85 g) cooked rice or pasta.  4 oz (113 g) cooked dried beans.  4 oz (113 g) starchy vegetable, such as peas, corn, or potatoes.  4 oz (113 g) hot cereal.  4 oz (113 g) mashed potatoes or  of a large baked potato.  4 oz (113 g) canned or frozen fruit.  4 oz (120 mL) fruit juice.  4-6 crackers.  6 chicken nuggets.  6 oz (170 g) unsweetened dry cereal.  6 oz (170 g) plain fat-free yogurt or yogurt sweetened with artificial sweeteners.  8 oz (240 mL) milk.  8 oz (170 g) fresh fruit or one small piece of fruit.  24 oz (680 g) popped popcorn.  Example of carbohydrate counting Sample meal  3 oz (85 g) chicken breast.  6 oz (170 g) brown rice.  4 oz (113 g) corn.  8 oz (240 mL) milk.  8 oz (170 g) strawberries with sugar-free whipped topping. Carbohydrate calculation 1. Identify the foods that contain carbohydrates: ? Rice. ? Corn. ? Milk. ? Strawberries. 2. Calculate how many servings you have of each food: ? 2 servings rice. ? 1 serving corn. ? 1 serving milk. ? 1 serving strawberries. 3. Multiply each number of servings by 15 g: ? 2 servings rice x 15 g = 30 g. ? 1 serving corn x 15 g = 15  g. ? 1 serving milk x 15 g = 15 g. ? 1 serving strawberries x 15 g = 15 g. 4. Add together all of the amounts to find the total grams of carbohydrates eaten: ? 30 g + 15 g + 15 g + 15 g = 75 g of carbohydrates total. This information is not intended to replace advice given to you by your health care provider. Make sure you discuss any questions you have with your health care provider. Document Released: 06/27/2005 Document Revised: 01/15/2016 Document Reviewed: 12/09/2015 Elsevier Interactive Patient Education  2018 [employer] Maintenance for Postmenopausal Women Menopause is a normal process in which your reproductive ability comes to an end. This process happens gradually over a span of months to years, usually between the ages of 3 and 52. Menopause is complete when you have missed 12 consecutive menstrual periods. It is important to talk with your health care provider about some of the most common conditions that affect postmenopausal women, such as heart disease, cancer, and bone loss (osteoporosis). Adopting a healthy lifestyle and getting preventive care can help to promote your health and wellness. Those actions can also lower your chances of developing some of these common conditions. What should I know about menopause? During menopause, you may experience a number of symptoms, such as:  Moderate-to-severe hot flashes.  Night sweats.  Decrease in sex drive.  Mood swings.  Headaches.  Tiredness.  Irritability.  Memory problems.  Insomnia.  Choosing to treat or not to treat menopausal changes is an individual decision that you make with your health care provider. What should I know about hormone replacement therapy and supplements? Hormone therapy products are effective for treating symptoms that are associated with menopause, such as hot flashes and night sweats. Hormone replacement carries certain risks, especially as you become older. If you are thinking  about using estrogen or estrogen with progestin treatments, discuss the benefits and risks with your health care provider. What should I know about heart disease and stroke? Heart disease, heart attack, and stroke become more likely as you age. This may be due, in part, to the hormonal changes that your body experiences during menopause. These can affect how your body processes dietary fats, triglycerides, and cholesterol. Heart attack and stroke are both medical emergencies. There are many things that you can do to help prevent heart disease and stroke:  Have your blood pressure checked at least every 1-2 years. High blood pressure causes heart disease and increases the risk of stroke.  If you are 58-101 years old, ask your health care provider if you should take aspirin to prevent a heart attack or a stroke.  Do not use any tobacco products, including cigarettes, chewing tobacco, or electronic cigarettes. If you need help quitting, ask your health care provider.  It is important to eat a healthy diet and maintain  a healthy weight. ? Be sure to include plenty of vegetables, fruits, low-fat dairy products, and lean protein. ? Avoid eating foods that are high in solid fats, added sugars, or salt (sodium).  Get regular exercise. This is one of the most important things that you can do for your health. ? Try to exercise for at least 150 minutes each week. The type of exercise that you do should increase your heart rate and make you sweat. This is known as moderate-intensity exercise. ? Try to do strengthening exercises at least twice each week. Do these in addition to the moderate-intensity exercise.  Know your numbers.Ask your health care provider to check your cholesterol and your blood glucose. Continue to have your blood tested as directed by your health care provider.  What should I know about cancer screening? There are several types of cancer. Take the following steps to reduce your risk  and to catch any cancer development as early as possible. Breast Cancer  Practice breast self-awareness. ? This means understanding how your breasts normally appear and feel. ? It also means doing regular breast self-exams. Let your health care provider know about any changes, no matter how small.  If you are 97 or older, have a clinician do a breast exam (clinical breast exam or CBE) every year. Depending on your age, family history, and medical history, it may be recommended that you also have a yearly breast X-ray (mammogram).  If you have a family history of breast cancer, talk with your health care provider about genetic screening.  If you are at high risk for breast cancer, talk with your health care provider about having an MRI and a mammogram every year.  Breast cancer (BRCA) gene test is recommended for women who have family members with BRCA-related cancers. Results of the assessment will determine the need for genetic counseling and BRCA1 and for BRCA2 testing. BRCA-related cancers include these types: ? Breast. This occurs in males or females. ? Ovarian. ? Tubal. This may also be called fallopian tube cancer. ? Cancer of the abdominal or pelvic lining (peritoneal cancer). ? Prostate. ? Pancreatic.  Cervical, Uterine, and Ovarian Cancer Your health care provider may recommend that you be screened regularly for cancer of the pelvic organs. These include your ovaries, uterus, and vagina. This screening involves a pelvic exam, which includes checking for microscopic changes to the surface of your cervix (Pap test).  For women ages 21-65, health care providers may recommend a pelvic exam and a Pap test every three years. For women ages 20-65, they may recommend the Pap test and pelvic exam, combined with testing for human papilloma virus (HPV), every five years. Some types of HPV increase your risk of cervical cancer. Testing for HPV may also be done on women of any age who have  unclear Pap test results.  Other health care providers may not recommend any screening for nonpregnant women who are considered low risk for pelvic cancer and have no symptoms. Ask your health care provider if a screening pelvic exam is right for you.  If you have had past treatment for cervical cancer or a condition that could lead to cancer, you need Pap tests and screening for cancer for at least 20 years after your treatment. If Pap tests have been discontinued for you, your risk factors (such as having a new sexual partner) need to be reassessed to determine if you should start having screenings again. Some women have medical problems that increase the chance  of getting cervical cancer. In these cases, your health care provider may recommend that you have screening and Pap tests more often.  If you have a family history of uterine cancer or ovarian cancer, talk with your health care provider about genetic screening.  If you have vaginal bleeding after reaching menopause, tell your health care provider.  There are currently no reliable tests available to screen for ovarian cancer.  Lung Cancer Lung cancer screening is recommended for adults 24-36 years old who are at high risk for lung cancer because of a history of smoking. A yearly low-dose CT scan of the lungs is recommended if you:  Currently smoke.  Have a history of at least 30 pack-years of smoking and you currently smoke or have quit within the past 15 years. A pack-year is smoking an average of one pack of cigarettes per day for one year.  Yearly screening should:  Continue until it has been 15 years since you quit.  Stop if you develop a health problem that would prevent you from having lung cancer treatment.  Colorectal Cancer  This type of cancer can be detected and can often be prevented.  Routine colorectal cancer screening usually begins at age 24 and continues through age 65.  If you have risk factors for colon  cancer, your health care provider may recommend that you be screened at an earlier age.  If you have a family history of colorectal cancer, talk with your health care provider about genetic screening.  Your health care provider may also recommend using home test kits to check for hidden blood in your stool.  A small camera at the end of a tube can be used to examine your colon directly (sigmoidoscopy or colonoscopy). This is done to check for the earliest forms of colorectal cancer.  Direct examination of the colon should be repeated every 5-10 years until age 58. However, if early forms of precancerous polyps or small growths are found or if you have a family history or genetic risk for colorectal cancer, you may need to be screened more often.  Skin Cancer  Check your skin from head to toe regularly.  Monitor any moles. Be sure to tell your health care provider: ? About any new moles or changes in moles, especially if there is a change in a mole's shape or color. ? If you have a mole that is larger than the size of a pencil eraser.  If any of your family members has a history of skin cancer, especially at a young age, talk with your health care provider about genetic screening.  Always use sunscreen. Apply sunscreen liberally and repeatedly throughout the day.  Whenever you are outside, protect yourself by wearing long sleeves, pants, a wide-brimmed hat, and sunglasses.  What should I know about osteoporosis? Osteoporosis is a condition in which bone destruction happens more quickly than new bone creation. After menopause, you may be at an increased risk for osteoporosis. To help prevent osteoporosis or the bone fractures that can happen because of osteoporosis, the following is recommended:  If you are 61-30 years old, get at least 1,000 mg of calcium and at least 600 mg of vitamin D per day.  If you are older than age 26 but younger than age 94, get at least 1,200 mg of calcium and  at least 600 mg of vitamin D per day.  If you are older than age 26, get at least 1,200 mg of calcium and at least  800 mg of vitamin D per day.  Smoking and excessive alcohol intake increase the risk of osteoporosis. Eat foods that are rich in calcium and vitamin D, and do weight-bearing exercises several times each week as directed by your health care provider. What should I know about how menopause affects my mental health? Depression may occur at any age, but it is more common as you become older. Common symptoms of depression include:  Low or sad mood.  Changes in sleep patterns.  Changes in appetite or eating patterns.  Feeling an overall lack of motivation or enjoyment of activities that you previously enjoyed.  Frequent crying spells.  Talk with your health care provider if you think that you are experiencing depression. What should I know about immunizations? It is important that you get and maintain your immunizations. These include:  Tetanus, diphtheria, and pertussis (Tdap) booster vaccine.  Influenza every year before the flu season begins.  Pneumonia vaccine.  Shingles vaccine.  Your health care provider may also recommend other immunizations. This information is not intended to replace advice given to you by your health care provider. Make sure you discuss any questions you have with your health care provider. Document Released: 08/19/2005 Document Revised: 01/15/2016 Document Reviewed: 03/31/2015 Elsevier Interactive Patient Education  2018 [employer].

## 2017-06-22 NOTE — Progress Notes (Signed)
Catherine Bailey 12/31/1965 [id number]    History:    Presents for annual exam.  Postmenopausal with no bleeding since [facility] 07/2016. No HRT.. 07/2016 benign endometrial polyps, follow-up sonohysterogram scheduled February 2019 with Dr. Phineas Real. Normal Pap and mammogram history. Has not had a screening colonoscopy.  Past medical history, past surgical history, family history and social history were all reviewed and documented in the EPIC chart. Works for the city of [place]. Daughter 33 has had Gardasil and is in college doing well.  ROS:  A ROS was performed and pertinent positives and negatives are included.  Exam:  Vitals:   06/22/17 0841  BP: 124/80  Weight: 190 lb (86.2 kg)  Height: 5\' 3"  (1.6 m)   Body mass index is 33.66 kg/m.   General appearance:  Normal Thyroid:  Symmetrical, normal in size, without palpable masses or nodularity. Respiratory  Auscultation:  Clear without wheezing or rhonchi Cardiovascular  Auscultation:  Regular rate, without rubs, murmurs or gallops  Edema/varicosities:  Not grossly evident Abdominal  Soft,nontender, without masses, guarding or rebound.  Liver/spleen:  No organomegaly noted  Hernia:  None appreciated  Skin  Inspection:  Grossly normal   Breasts: Examined lying and sitting.     Right: Without masses, retractions, discharge or axillary adenopathy.     Left: Without masses, retractions, discharge or axillary adenopathy. Gentitourinary   Inguinal/mons:  Normal without inguinal adenopathy  External genitalia:  Normal  BUS/Urethra/Skene's glands:  Normal  Vagina:  Normal  Cervix:  Normal  Uterus: normal in size, shape and contour.  Midline and mobile  Adnexa/parametria:     Rt: Without masses or tenderness.   Lt: Without masses or tenderness.  Anus and perineum: Normal  Digital rectal exam: Normal sphincter tone without palpated masses or tenderness  Assessment/Plan:  51 y.o. MWF G1 P1 for annual exam with no complaints.  07/2016  benign endometrial polyps Obesity  Plan: Schedule sonohysterogram in February with Dr. Phineas Real. Instructed to call if any further bleeding. SBE's, continue annual screening mammogram, due in January. Reviewed importance of screening colonoscopy, [facility] information given, instructed to schedule. Encouraged increased exercise, decrease calories/carbs for weight loss encouraged. Pap normal with negative HR HPV 2017, new screening guidelines reviewed. CBC, lipid panel, CMP.    [name] [facility], 12:51 PM 06/22/2017

## 2017-06-23 LAB — LIPID PANEL
CHOLESTEROL: 191 mg/dL (ref <200)
HDL: 65 mg/dL (ref >50)
LDL CHOLESTEROL (CALC): 106 mg/dL — AB
Non-HDL Cholesterol (Calc): 126 mg/dL (calc) (ref <130)
Total CHOL/HDL Ratio: 2.9 (calc) (ref <5.0)
Triglycerides: 104 mg/dL (ref <150)

## 2017-06-23 LAB — COMPREHENSIVE METABOLIC PANEL
AG RATIO: 1.4 (calc) (ref 1.0–2.5)
ALBUMIN MSPROF: 4.2 g/dL (ref 3.6–5.1)
ALKALINE PHOSPHATASE (APISO): 86 U/L (ref 33–130)
ALT: 19 U/L (ref 6–29)
AST: 17 U/L (ref 10–35)
BUN: 12 mg/dL (ref 7–25)
CHLORIDE: 102 mmol/L (ref 98–110)
CO2: 27 mmol/L (ref 20–32)
Calcium: 9.5 mg/dL (ref 8.6–10.4)
Creat: 0.82 mg/dL (ref 0.50–1.05)
GLOBULIN: 2.9 g/dL (ref 1.9–3.7)
Glucose, Bld: 99 mg/dL (ref 65–99)
POTASSIUM: 4.1 mmol/L (ref 3.5–5.3)
SODIUM: 138 mmol/L (ref 135–146)
TOTAL PROTEIN: 7.1 g/dL (ref 6.1–8.1)
Total Bilirubin: 0.4 mg/dL (ref 0.2–1.2)

## 2017-06-23 LAB — CBC WITH DIFFERENTIAL/PLATELET
BASOS PCT: 0.8 %
Basophils Absolute: 50 cells/uL (ref 0–200)
EOS PCT: 1.9 %
Eosinophils Absolute: 118 cells/uL (ref 15–500)
HCT: 41.8 % (ref 35.0–45.0)
Hemoglobin: 13.7 g/dL (ref 11.7–15.5)
Lymphs Abs: 1513 cells/uL (ref 850–3900)
MCH: 27.5 pg (ref 27.0–33.0)
MCHC: 32.8 g/dL (ref 32.0–36.0)
MCV: 83.8 fL (ref 80.0–100.0)
MONOS PCT: 5.3 %
MPV: 8.8 fL (ref 7.5–12.5)
NEUTROS ABS: 4191 {cells}/uL (ref 1500–7800)
Neutrophils Relative %: 67.6 %
Platelets: 256 10*3/uL (ref 140–400)
RBC: 4.99 10*6/uL (ref 3.80–5.10)
RDW: 12.8 % (ref 11.0–15.0)
Total Lymphocyte: 24.4 %
WBC mixed population: 329 cells/uL (ref 200–950)
WBC: 6.2 10*3/uL (ref 3.8–10.8)

## 2017-06-24 LAB — URINE CULTURE
MICRO NUMBER: 81407733
SPECIMEN QUALITY:: ADEQUATE

## 2017-06-24 LAB — URINALYSIS W MICROSCOPIC + REFLEX CULTURE
BACTERIA UA: NONE SEEN /HPF
Bilirubin Urine: NEGATIVE
Glucose, UA: NEGATIVE
HGB URINE DIPSTICK: NEGATIVE
Ketones, ur: NEGATIVE
Nitrites, Initial: NEGATIVE
PROTEIN: NEGATIVE
RBC / HPF: NONE SEEN /HPF (ref 0–2)
Specific Gravity, Urine: 1.013 (ref 1.001–1.03)
WBC UA: NONE SEEN /HPF (ref 0–5)
pH: 6.5 (ref 5.0–8.0)

## 2017-06-24 LAB — CULTURE INDICATED

## 2017-07-21 ENCOUNTER — Ambulatory Visit
Admission: RE | Admit: 2017-07-21 | Discharge: 2017-07-21 | Disposition: A | Discharge status: Home / Self Care | Payer: 59 | Source: Ambulatory Visit | Attending: Women's Health | Admitting: Women's Health

## 2017-07-21 DIAGNOSIS — Z1231 Encounter for screening mammogram for malignant neoplasm of breast: Secondary | ICD-10-CM

## 2017-08-10 ENCOUNTER — Other Ambulatory Visit: Payer: Self-pay | Admitting: Gynecology

## 2017-08-10 DIAGNOSIS — N8500 Endometrial hyperplasia, unspecified: Secondary | ICD-10-CM

## 2017-08-23 ENCOUNTER — Ambulatory Visit: Payer: 59 | Admitting: Gynecology

## 2017-08-23 ENCOUNTER — Ambulatory Visit (INDEPENDENT_AMBULATORY_CARE_PROVIDER_SITE_OTHER): Payer: 59

## 2017-08-23 ENCOUNTER — Other Ambulatory Visit: Payer: Self-pay | Admitting: Gynecology

## 2017-08-23 ENCOUNTER — Encounter: Payer: Self-pay | Admitting: Gynecology

## 2017-08-23 VITALS — BP 120/74

## 2017-08-23 DIAGNOSIS — N8500 Endometrial hyperplasia, unspecified: Secondary | ICD-10-CM

## 2017-08-23 DIAGNOSIS — N8501 Benign endometrial hyperplasia: Secondary | ICD-10-CM

## 2017-08-23 DIAGNOSIS — D251 Intramural leiomyoma of uterus: Secondary | ICD-10-CM | POA: Diagnosis not present

## 2017-08-23 DIAGNOSIS — Z8742 Personal history of other diseases of the female genital tract: Secondary | ICD-10-CM

## 2017-08-23 NOTE — Progress Notes (Signed)
    Catherine Bailey [birth year] [id number]        52 y.o.  G1P0001 presents for sonohysterogram.  History of endometrial polyps a year ago which showed simple and complex hyperplasia without atypia.  The surrounding endometrial curettage showed atrophic endometrium.  We discussed options and as it appears that the hyperplasia was limited to the polyps and not the surrounding endometrium we elected to wait and follow-up with sonohysterogram in 1 year to make sure there is no recurrence of her endometrial polyps or endometrial hyperplasia.  Patient has done well without any bleeding or other issues.  Past medical history,surgical history, problem list, medications, allergies, family history and social history were all reviewed and documented in the EPIC chart.  Directed ROS with pertinent positives and negatives documented in the history of present illness/assessment and plan.  Exam: [place] assistant BP 120/74 General appearance:  Normal Abdomen soft nontender without masses guarding rebound Pelvic external BUS vagina with atrophic changes.  Cervix with atrophic changes.  Uterus normal size midline mobile nontender.  Adnexa without mass  Ultrasound transvaginal shows uterus normal size and echotexture.  Small myoma 15 x 12 mm noted.  Endometrial echo 4.6 mm.  Right and left ovaries normal.  Cul-de-sac negative.  Sonohysterogram performed, sterile technique, easy catheter introduction, good distention with no abnormalities.  Endometrial sample taken.  Patient tolerated well.  Assessment/Plan:  52 y.o. 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  Surrounding curettage showed atrophic endometrium.  Ultrasound today shows relatively thin endometrium with endometrial echo 4.6 mm.  Sonohysterogram shows no polyps or other abnormalities.  Patient will follow-up for endometrial biopsy results and as long as negative then plan expectant  management.    Anastasio Auerbach MD, 3:38 PM 08/23/2017

## 2017-08-23 NOTE — Patient Instructions (Signed)
Office will call you with biopsy results 

## 2019-04-03 ENCOUNTER — Encounter: Payer: Self-pay | Admitting: Gynecology

## 2020-04-27 ENCOUNTER — Other Ambulatory Visit: Payer: Self-pay

## 2020-04-27 ENCOUNTER — Encounter: Payer: Self-pay | Admitting: Nurse Practitioner

## 2020-04-27 ENCOUNTER — Ambulatory Visit: Payer: 59 | Admitting: Nurse Practitioner

## 2020-04-27 VITALS — BP 120/80

## 2020-04-27 DIAGNOSIS — N9489 Other specified conditions associated with female genital organs and menstrual cycle: Secondary | ICD-10-CM | POA: Diagnosis not present

## 2020-04-27 DIAGNOSIS — Z780 Asymptomatic menopausal state: Secondary | ICD-10-CM

## 2020-04-27 DIAGNOSIS — Z8742 Personal history of other diseases of the female genital tract: Secondary | ICD-10-CM | POA: Diagnosis not present

## 2020-04-27 NOTE — Progress Notes (Signed)
   Acute Office Visit  Subjective:    Patient ID: Catherine Bailey, female    DOB: 1966/03/27, 54 y.o.   MRN: [id number]   HPI 54 y.o. presents today for lower abdominal cramping that started a week and a half ago. Cramping happens daily, comes and goes, and is worse on days she is more active. Denies bleeding. Postmenopausal on no HRT. History of endometrial polyps in 2018 that showed complex hyperplasia without atypia. Repeat ultrasound in 2019 showed small myoma 15 x 12 mm, endometrial echo 4.6 mm, normal ovaries. Says symptoms are similar to when she had endometrial polyps. Mother with history of hysterectomy for endometrial precancerous cells and just finished chemotherapy and radiation for endometrial cancer that was found in vagina/pelvis.    Review of Systems  Constitutional: Negative.   Gastrointestinal: Negative.   Genitourinary: Positive for pelvic pain (cramping). Negative for vaginal bleeding.       Objective:    Physical Exam Constitutional:      Appearance: Normal appearance.  Abdominal:     General: There is no distension.     Tenderness: There is no abdominal tenderness.  Genitourinary:    General: Normal vulva.     Vagina: Normal.     Cervix: Normal.     Uterus: Normal.      BP 120/80   LMP 04/17/2016 (LMP Unknown)  Wt Readings from Last 3 Encounters:  06/22/17 190 lb (86.2 kg)  08/04/16 187 lb (84.8 kg)  07/08/16 185 lb (83.9 kg)        Assessment & Plan:   Problem List Items Addressed This Visit    None    Visit Diagnoses    Uterine cramping    -  Primary   Relevant Orders   US PELVIS TRANSVAGINAL NON-OB (TV ONLY)   History of endometrial hyperplasia       Relevant Orders   US PELVIS TRANSVAGINAL NON-OB (TV ONLY)   Postmenopausal          Plan: Due to history of endometrial polyps/hyperplasia and mother's history of endometrial cancer we will schedule an ultrasound. She is agreeable to plan.     [facility], 3:52 PM  04/27/2020

## 2020-05-06 ENCOUNTER — Ambulatory Visit: Payer: 59 | Admitting: Nurse Practitioner

## 2020-05-06 ENCOUNTER — Other Ambulatory Visit: Payer: 59

## 2020-05-07 ENCOUNTER — Other Ambulatory Visit: Payer: Self-pay

## 2020-05-07 ENCOUNTER — Ambulatory Visit (INDEPENDENT_AMBULATORY_CARE_PROVIDER_SITE_OTHER): Payer: 59

## 2020-05-07 ENCOUNTER — Ambulatory Visit (INDEPENDENT_AMBULATORY_CARE_PROVIDER_SITE_OTHER): Payer: 59 | Admitting: Nurse Practitioner

## 2020-05-07 ENCOUNTER — Encounter: Payer: Self-pay | Admitting: Nurse Practitioner

## 2020-05-07 VITALS — BP 118/80

## 2020-05-07 DIAGNOSIS — N854 Malposition of uterus: Secondary | ICD-10-CM | POA: Diagnosis not present

## 2020-05-07 DIAGNOSIS — N9489 Other specified conditions associated with female genital organs and menstrual cycle: Secondary | ICD-10-CM

## 2020-05-07 DIAGNOSIS — R109 Unspecified abdominal pain: Secondary | ICD-10-CM

## 2020-05-07 DIAGNOSIS — R9389 Abnormal findings on diagnostic imaging of other specified body structures: Secondary | ICD-10-CM

## 2020-05-07 DIAGNOSIS — Z8742 Personal history of other diseases of the female genital tract: Secondary | ICD-10-CM

## 2020-05-07 DIAGNOSIS — Z8049 Family history of malignant neoplasm of other genital organs: Secondary | ICD-10-CM | POA: Diagnosis not present

## 2020-05-07 NOTE — Progress Notes (Signed)
History: 54 year old presents for ultrasound follow-up.  Was seen by me 04/27/2020 for lower abdominal cramping that started a week and a half prior.  Cramping was daily, intermittent, and worse on days she is more active.  Denies vaginal bleeding.  Postmenopausal on no HRT.  She does have a history of endometrial polyps that showed complex hyperplasia without atypia in 2018.  Mother had hysterectomy for endometrial precancerous cells years ago but was recently treated for endometrial cancer that was found in vagina/pelvis. She reports cramping has stopped.   Exam: Appears well Ultrasound: Anteverted uterus, normal size and shape.  7 mm cystic space within the myometrium, cannot rule out degenerating fibroid versus adenomyosis streaking and shadowing seen.  Thin, symmetrical endometrium 2 mm with difficult visualization due to streaking and shadowing within the myometrium.  No masses or thickening seen.  Both ovaries seen.  No adnexal masses, no free fluid.  Assessment: Abdominal cramping Family history of uterine cancer - mother History of endometrial hyperplasia  Plan: Discussed ultrasound and reassurance provided on findings.  If she experiences abdominal pain with vaginal bleeding she is instructed to return.  She is agreeable to plan.

## 2020-05-18 ENCOUNTER — Other Ambulatory Visit: Payer: Self-pay

## 2020-05-18 ENCOUNTER — Encounter: Payer: Self-pay | Admitting: Emergency Medicine

## 2020-05-18 ENCOUNTER — Ambulatory Visit
Admission: EM | Admit: 2020-05-18 | Discharge: 2020-05-18 | Disposition: A | Discharge status: Home / Self Care | Payer: 59 | Attending: Emergency Medicine | Admitting: Emergency Medicine

## 2020-05-18 DIAGNOSIS — Z1152 Encounter for screening for COVID-19: Secondary | ICD-10-CM

## 2020-05-18 NOTE — Discharge Instructions (Signed)

## 2020-05-18 NOTE — ED Triage Notes (Signed)
Patient presents for COVID-19 testing for work.   Patient denies any COVID-19 symptoms.   Patient has no complaints today.

## 2020-05-20 LAB — NOVEL CORONAVIRUS, NAA: SARS-CoV-2, NAA: NOT DETECTED

## 2020-05-20 LAB — SARS-COV-2, NAA 2 DAY TAT
